# Patient Record
Sex: Male | Born: 1970 | Race: White | Hispanic: No | Marital: Married | Smoking: Never smoker
Health system: Southern US, Community
[De-identification: ages and names within clinical notes are randomized; demographics above are authoritative.]

## PROBLEM LIST (undated history)

## (undated) DIAGNOSIS — T8859XA Other complications of anesthesia, initial encounter: Secondary | ICD-10-CM

## (undated) DIAGNOSIS — E119 Type 2 diabetes mellitus without complications: Secondary | ICD-10-CM

## (undated) DIAGNOSIS — C801 Malignant (primary) neoplasm, unspecified: Secondary | ICD-10-CM

## (undated) DIAGNOSIS — F419 Anxiety disorder, unspecified: Secondary | ICD-10-CM

## (undated) DIAGNOSIS — I10 Essential (primary) hypertension: Secondary | ICD-10-CM

## (undated) DIAGNOSIS — Z9889 Other specified postprocedural states: Secondary | ICD-10-CM

## (undated) DIAGNOSIS — T4145XA Adverse effect of unspecified anesthetic, initial encounter: Secondary | ICD-10-CM

## (undated) DIAGNOSIS — R112 Nausea with vomiting, unspecified: Secondary | ICD-10-CM

## (undated) DIAGNOSIS — G4730 Sleep apnea, unspecified: Secondary | ICD-10-CM

## (undated) HISTORY — PX: APPENDECTOMY: SHX54

## (undated) HISTORY — PX: HIP ARTHROPLASTY: SHX981

## (undated) HISTORY — PX: HERNIA REPAIR: SHX51

## (undated) HISTORY — PX: CHOLECYSTECTOMY: SHX55

---
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
## 2012-01-12 DIAGNOSIS — C801 Malignant (primary) neoplasm, unspecified: Secondary | ICD-10-CM

## 2012-01-12 HISTORY — DX: Malignant (primary) neoplasm, unspecified: C80.1

## 2012-02-02 HISTORY — PX: VIDEO ASSISTED THORACOSCOPY (VATS)/ LOBECTOMY: SHX6169

## 2014-01-11 HISTORY — PX: OTHER SURGICAL HISTORY: SHX169

## 2016-06-22 ENCOUNTER — Other Ambulatory Visit: Payer: Self-pay | Admitting: Neurosurgery

## 2016-06-22 DIAGNOSIS — M5416 Radiculopathy, lumbar region: Secondary | ICD-10-CM

## 2016-06-28 ENCOUNTER — Ambulatory Visit
Admission: RE | Admit: 2016-06-28 | Discharge: 2016-06-28 | Disposition: A | Discharge status: Home / Self Care | Payer: BLUE CROSS/BLUE SHIELD | Source: Ambulatory Visit | Attending: Neurosurgery | Admitting: Neurosurgery

## 2016-06-28 DIAGNOSIS — M5416 Radiculopathy, lumbar region: Secondary | ICD-10-CM

## 2016-06-28 MED ORDER — GADOBENATE DIMEGLUMINE 529 MG/ML IV SOLN
20.0000 mL | Freq: Once | INTRAVENOUS | Status: AC | PRN
  Administered 2016-06-28: 20 mL via INTRAVENOUS

## 2016-08-02 ENCOUNTER — Ambulatory Visit: Payer: BLUE CROSS/BLUE SHIELD | Admitting: Sports Medicine

## 2016-08-03 ENCOUNTER — Ambulatory Visit (INDEPENDENT_AMBULATORY_CARE_PROVIDER_SITE_OTHER): Payer: BLUE CROSS/BLUE SHIELD | Admitting: Sports Medicine

## 2016-08-03 ENCOUNTER — Encounter: Payer: Self-pay | Admitting: Sports Medicine

## 2016-08-03 DIAGNOSIS — M7062 Trochanteric bursitis, left hip: Secondary | ICD-10-CM

## 2016-08-03 DIAGNOSIS — G5712 Meralgia paresthetica, left lower limb: Secondary | ICD-10-CM | POA: Insufficient documentation

## 2016-08-03 NOTE — Assessment & Plan Note (Signed)
History of hip labral tear, the arthrogram injection included some steroid has since relieved his groin pain, he now has pain on the lateral aspect of his hip with week hip abductors. This is consistent with trochanteric bursitis, injection today, formal physical therapy, turn to see me in one month.

## 2016-08-03 NOTE — Assessment & Plan Note (Signed)
Ultimately he does need to lose weight, and wear looser fitting clothing before we consider an injection.

## 2016-08-03 NOTE — Assessment & Plan Note (Signed)
Recommended that he follow-up with his PCP regarding weight loss medications, and possibly gastric sleeve.

## 2016-08-03 NOTE — Progress Notes (Signed)
Subjective:    I'm seeing this patient as a consultation for:  Dr. Margaretha Sheffield, Dr. Yvetta Coder, Dr. Jamesetta Orleans  CC: Left hip pain  HPI: This is a pleasant 46 year old male with a fairly complicated medical history, initially he had left-sided hip and back pain, he had an MRI and ultimately four epidurals with the pain management provider. Unfortunately he never had any relief, not even temporary after the injections.  Subsequently he was seen by Dr. Vertell Limber with neurosurgery, a lumbar spine MRI was done that was negative. Eventually he had an MR arthrogram of his left hip with steroid injection, the arthrogram did show some labral fraying, and steroid injection provided about 4 months of relief. Since then he's had pain that he localizes on the lateral hip, with occasional paresthesias into the lateral thigh. Pain at the lateral hip is severe, persistent and is the principal issue today.  Past medical history, Surgical history, Family history not pertinant except as noted below, Social history, Allergies, and medications have been entered into the medical record, reviewed, and no changes needed.   Review of Systems: No headache, visual changes, nausea, vomiting, diarrhea, constipation, dizziness, abdominal pain, skin rash, fevers, chills, night sweats, weight loss, swollen lymph nodes, body aches, joint swelling, muscle aches, chest pain, shortness of breath, mood changes, visual or auditory hallucinations.   Objective:   General: Well Developed, morbidly obese, and in no acute distress.  Neuro:  Extra-ocular muscles intact, able to move all 4 extremities, sensation grossly intact.  Deep tendon reflexes tested were normal. Psych: Alert and oriented, mood congruent with affect. ENT:  Ears and nose appear unremarkable.  Hearing grossly normal. Neck: Unremarkable overall appearance, trachea midline.  No visible thyroid enlargement. Eyes: Conjunctivae and lids appear unremarkable.  Pupils  equal and round. Skin: Warm and dry, no rashes noted.  Cardiovascular: Pulses palpable, no extremity edema. Left Hip: ROM IR: 60 Deg, ER: 60 Deg, Flexion: 120 Deg, Extension: 100 Deg, Abduction: 45 Deg, Adduction: 45 Deg Strength IR: 5/5, ER: 5/5, Flexion: 5/5, Extension: 5/5, Abduction: 5/5, Adduction: 5/5 Pelvic alignment unremarkable to inspection and palpation. Standing hip rotation and gait without trendelenburg / unsteadiness. Greater trochanter with severe tenderness to palpation. No tenderness over piriformis. No SI joint tenderness and normal minimal SI movement.  Procedure: Real-time Ultrasound Guided Injection of left entered bursa Device: GE Logiq E  Verbal informed consent obtained.  Time-out conducted.  Noted no overlying erythema, induration, or other signs of local infection.  Skin prepped in a sterile fashion.  Local anesthesia: Topical Ethyl chloride.  With sterile technique and under real time ultrasound guidance:  Using a 22-gauge spinal needle I placed medication both superficial to the hip abductor tendons, I then pierced the abductors, entered the bursa and injected the rest of medicine for a total of 1 mL kenalog 40, 2 mL bupivacaine, 2 mL lidocaine. Completed without difficulty  Pain immediately resolved suggesting accurate placement of the medication.  Advised to call if fevers/chills, erythema, induration, drainage, or persistent bleeding.  Images permanently stored and available for review in the ultrasound unit.  Impression: Technically successful ultrasound guided injection.  Impression and Recommendations:   This case required medical decision making of moderate complexity.  Meralgia paresthetica, left Ultimately he does need to lose weight, and wear looser fitting clothing before we consider an injection.  Morbid obesity ([place]) Recommended that he follow-up with his PCP regarding weight loss medications, and possibly gastric sleeve.  Trochanteric  bursitis, left hip  History of hip labral tear, the arthrogram injection included some steroid has since relieved his groin pain, he now has pain on the lateral aspect of his hip with week hip abductors. This is consistent with trochanteric bursitis, injection today, formal physical therapy, turn to see me in one month.

## 2016-08-31 ENCOUNTER — Ambulatory Visit: Payer: BLUE CROSS/BLUE SHIELD | Admitting: Sports Medicine

## 2018-07-01 IMAGING — MR MR LUMBAR SPINE WO/W CM
4 of 7 series · 28 of 48 positions shown · IV contrast (multihance)
Comparison: Outside lumbar spine MRI 01/08/2016

CLINICAL DATA: Lumbar radiculopathy. Low back pain. Left hip and
left foot numbness. History of lung cancer.

Creatinine was obtained on site at [HOSPITAL] at [HOSPITAL].
Results: Creatinine 0.8 mg/dL.
EXAM:
MRI LUMBAR SPINE WITHOUT AND WITH CONTRAST
TECHNIQUE: Multiplanar and multiecho pulse sequences of the lumbar spine were
obtained without and with intravenous contrast.
CONTRAST:  20mL MULTIHANCE GADOBENATE DIMEGLUMINE 529 MG/ML IV SOLN

[Series 4: T1 · sagittal · 4.0mm · 0.59mm/px · 4 of 13 slices shown (1 of 2)]
[im 1/13]
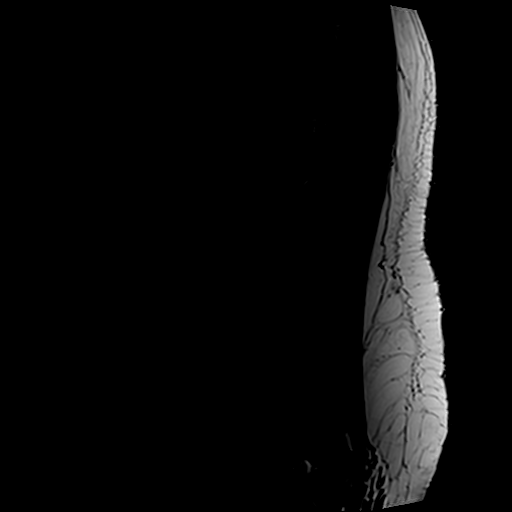
[im 5/13]
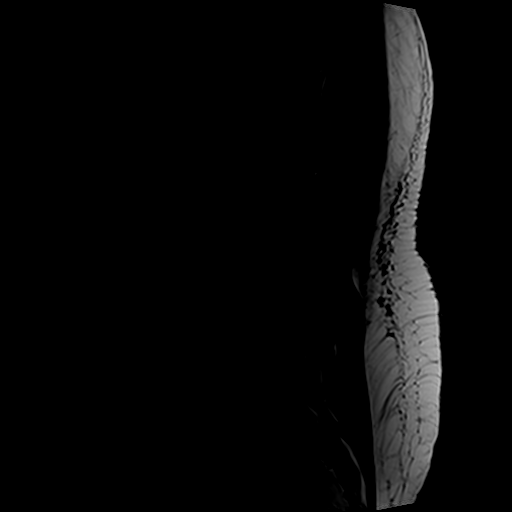
[im 9/13]
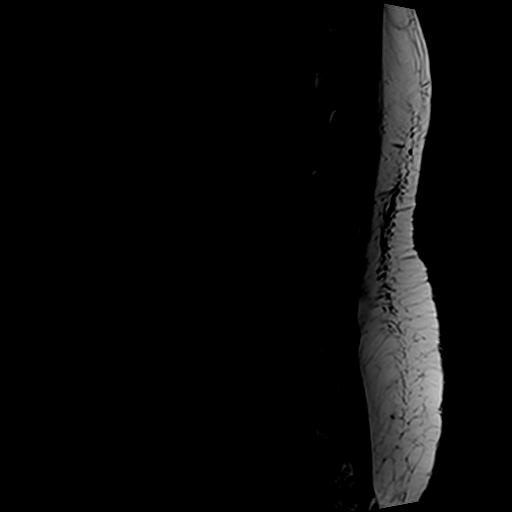
[im 13/13]
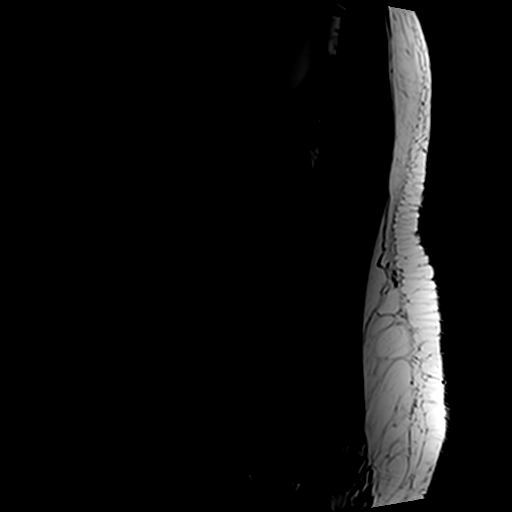

[Series 5: T2 post-contrast · sagittal · 4.0mm · 0.55mm/px · 4 of 13 slices shown]
[im 1/13]
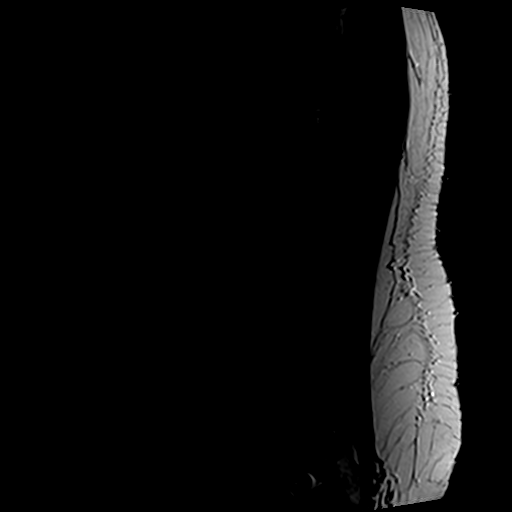
[im 5/13]
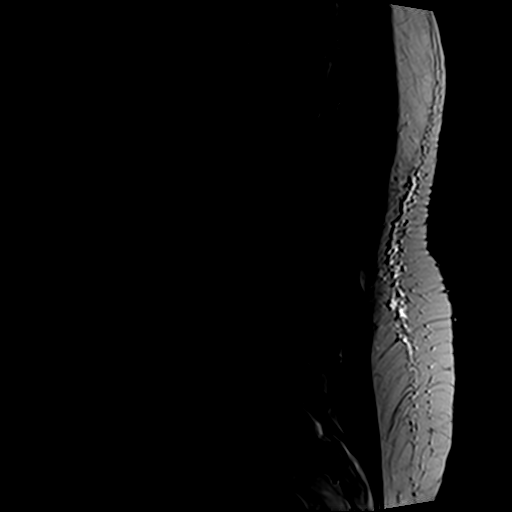
[im 9/13]
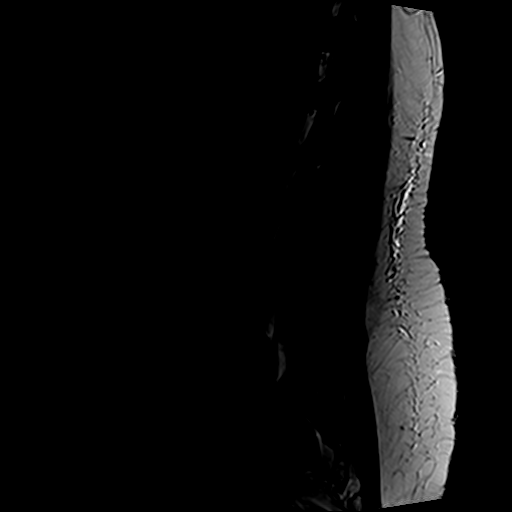
[im 13/13]
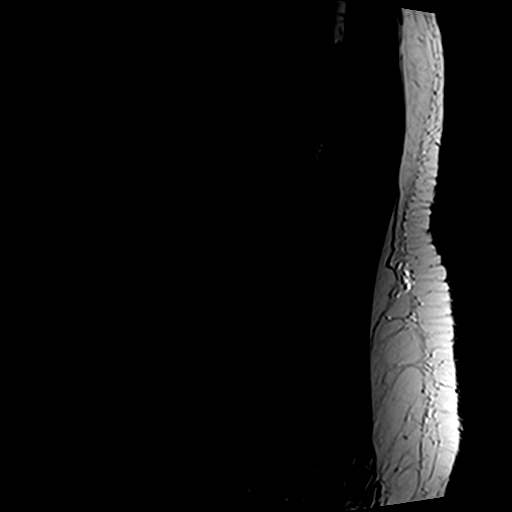

[Series 6: T2 · axial · 4.0mm · 0.70mm/px · z∈[-52,+154]mm · 11 of 38 slices shown]
[im 1/38]
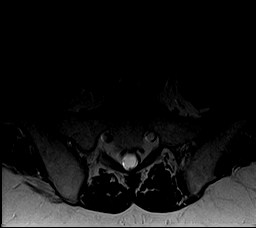
[im 4/38]
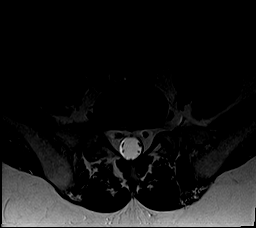
[im 8/38]
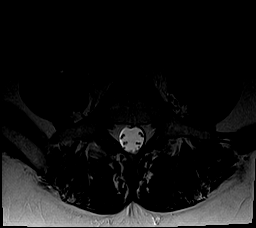
[im 12/38]
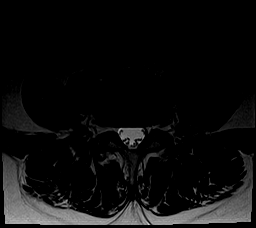
[im 15/38]
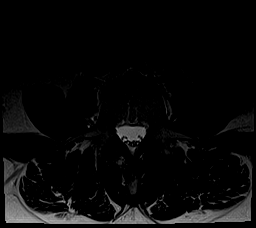
[im 19/38]
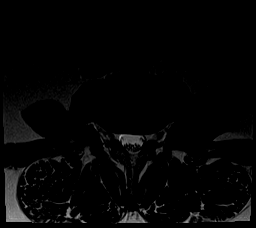
[im 23/38]
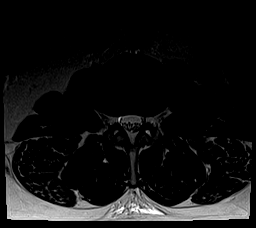
[im 26/38]
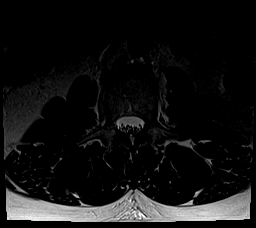
[im 30/38]
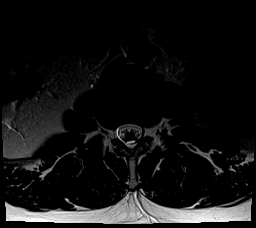
[im 34/38]
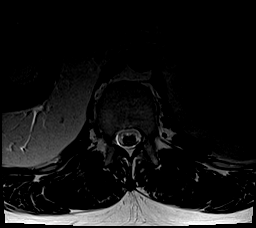
[im 38/38]
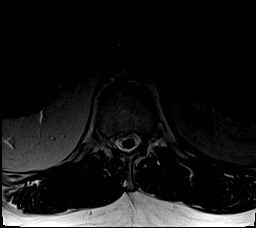

[Series 7: T1 · axial · 4.0mm · 0.35mm/px · z∈[-52,+134]mm · 9 of 38 slices shown (2 of 2)]
[im 1/38]
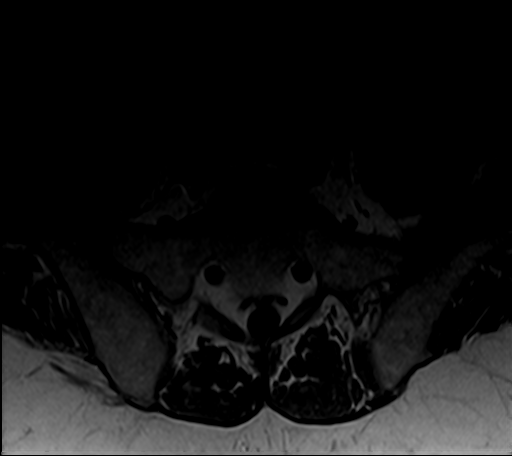
[im 4/38]
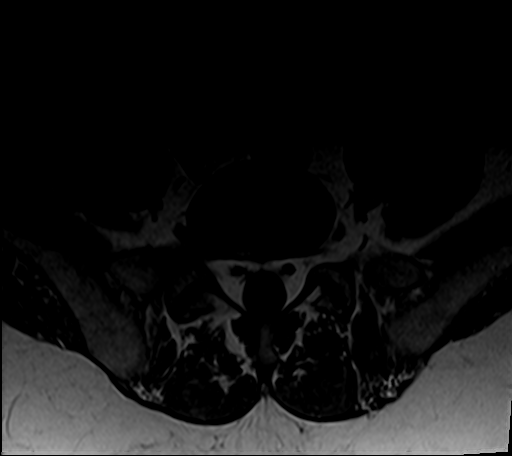
[im 8/38]
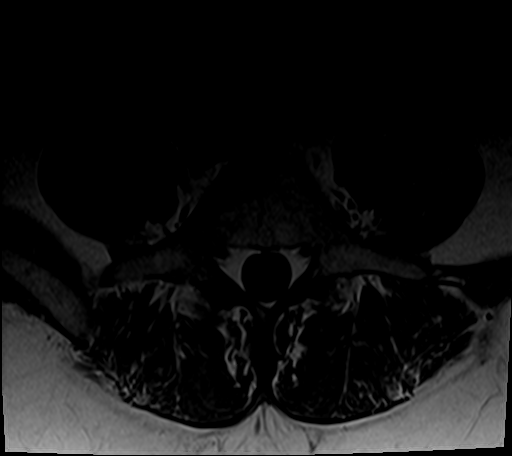
[im 12/38]
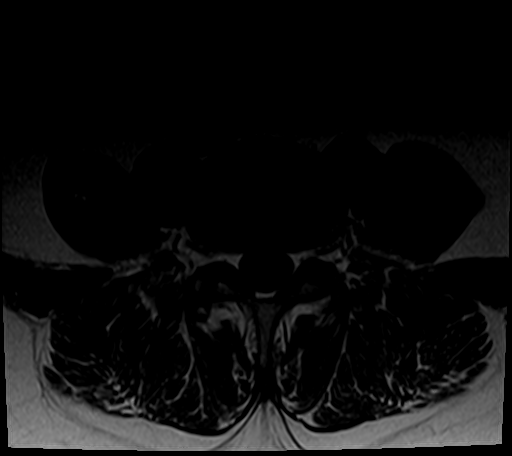
[im 15/38]
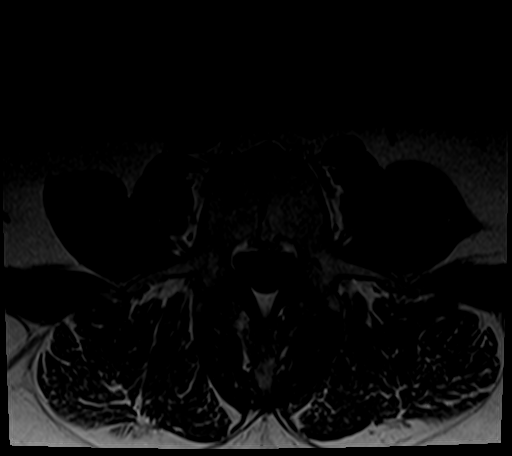
[im 19/38]
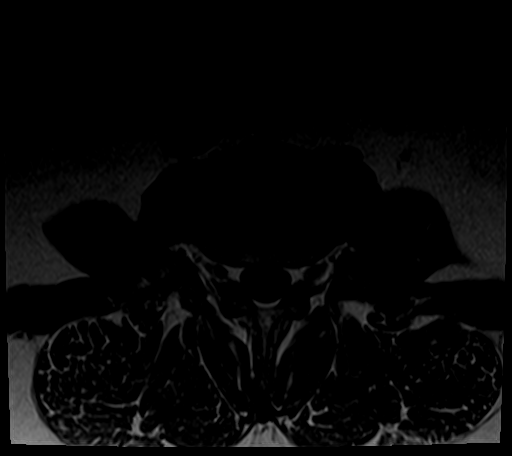
[im 23/38]
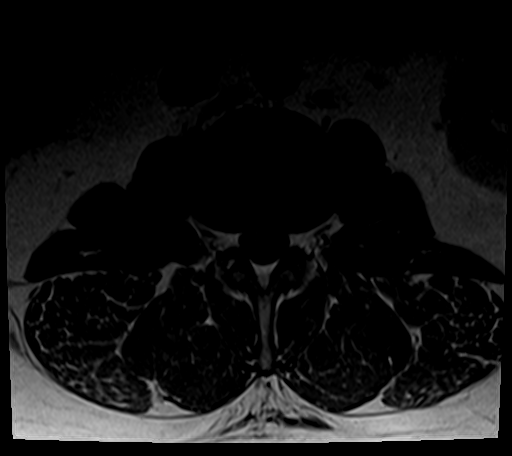
[im 26/38]
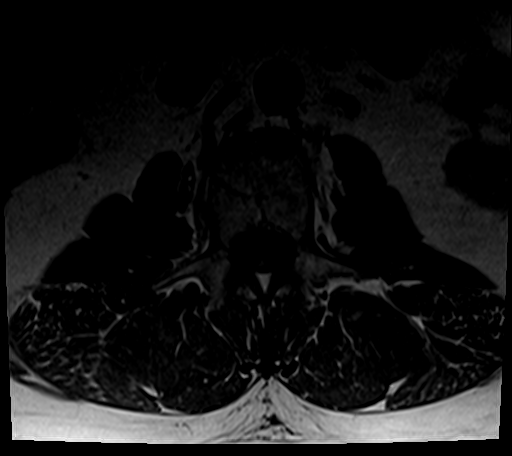
[im 34/38]
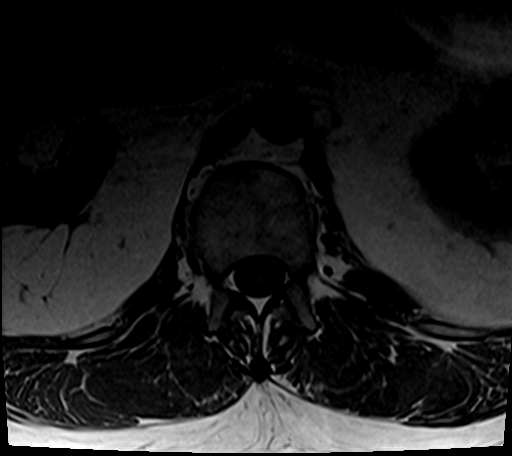

[28 of 48 positions shown; findings below may reference images not displayed]

FINDINGS: Segmentation:  Standard.

Alignment: Trace retrolisthesis of L2 on L3, L3 on L4, and L5 on S1,
unchanged.

Vertebrae: No fracture, osseous lesion, or significant marrow edema.
No abnormal enhancement. Small lower thoracic Schmorl's nodes,
unchanged.

Conus medullaris: Extends to the upper L2 level and appears normal.

Paraspinal and other soft tissues: Unremarkable.

Disc levels:

Disc desiccation from L2-3 to L4-5 without significant disc space
height loss.

T12-L1 and L1-2:  Negative.

L2-3:  Trace disc bulging without stenosis.

L3-4: Mild disc bulging without stenosis, unchanged. A tiny central
annular fissure is more conspicuous on the current study.

L4-5: Minimal disc bulging and mild facet arthrosis without
stenosis, unchanged.

L5-S1:  Negative.
IMPRESSION: Mild lumbar disc and facet degeneration without significant interval
change. No evidence of neural impingement.

## 2018-09-28 ENCOUNTER — Encounter (HOSPITAL_COMMUNITY): Payer: Self-pay

## 2018-09-28 NOTE — Patient Instructions (Signed)
Your procedure is scheduled on: 10/04/2018  Report to [facility] at 7:00    AM.  Call this number if you have problems the morning of surgery: [phone]   Remember:   Do not Eat or Drink after midnight   :  Take these medicines the morning of surgery with A SIP OF WATER: Benicar, Carvedilol, lexapro, omeprazole, zertec, and      Gabapetin if needed   Do not wear jewelry, make-up or nail polish.  Do not wear lotions, powders, or perfumes. You may wear deodorant.  Do not shave 48 hours prior to surgery. Men may shave face and neck.  Do not bring valuables to the hospital.  Contacts, dentures or bridgework may not be worn into surgery.  Leave suitcase in the car. After surgery it may be brought to your room.  For patients admitted to the hospital, checkout time is 11:00 AM the day of discharge.   Patients discharged the day of surgery will not be allowed to drive home.    Special Instructions: Shower using CHG night before surgery and shower the day of surgery use CHG.  Use special wash - you have one bottle of CHG for all showers.  You should use approximately 1/2 of the bottle for each shower.  Wound Care, Adult Taking care of your wound properly can help to prevent pain, infection, and scarring. It can also help your wound to heal more quickly. How to care for your wound Wound care      Follow instructions from your health care provider about how to take care of your wound. Make sure you: ? Wash your hands with soap and water before you change the bandage (dressing). If soap and water are not available, use hand sanitizer. ? Change your dressing as told by your health care provider. ? Leave stitches (sutures), skin glue, or adhesive strips in place. These skin closures may need to stay in place for 2 weeks or longer. If adhesive strip edges start to loosen and curl up, you may trim the loose edges. Do not remove adhesive strips completely unless your health care provider tells  you to do that.  Check your wound area every day for signs of infection. Check for: ? Redness, swelling, or pain. ? Fluid or blood. ? Warmth. ? Pus or a bad smell.  Ask your health care provider if you should clean the wound with mild soap and water. Doing this may include: ? Using a clean towel to pat the wound dry after cleaning it. Do not rub or scrub the wound. ? Applying a cream or ointment. Do this only as told by your health care provider. ? Covering the incision with a clean dressing.  Ask your health care provider when you can leave the wound uncovered.  Keep the dressing dry until your health care provider says it can be removed. Do not take baths, swim, use a hot tub, or do anything that would put the wound underwater until your health care provider approves. Ask your health care provider if you can take showers. You may only be allowed to take sponge baths. Medicines     Take over-the-counter and prescription medicines only as told by your health care provider. If you were prescribed pain medicine, take it 30 or more minutes before you do any wound care or as told by your health care provider. General instructions  Return to your normal activities as told by your health care provider. Ask your health  care provider what activities are safe.  Do not scratch or pick at the wound.  Do not use any products that contain nicotine or tobacco, such as cigarettes and e-cigarettes. These may delay wound healing. If you need help quitting, ask your health care provider.  Keep all follow-up visits as told by your health care provider. This is important.  Eat a diet that includes protein, vitamin A, vitamin C, and other nutrient-rich foods to help the wound heal. ? Foods rich in protein include meat, dairy, beans, nuts, and other sources. ? Foods rich in vitamin A include carrots and dark green, leafy vegetables. ? Foods rich in vitamin C include citrus, tomatoes, and other fruits  and vegetables. ? Nutrient-rich foods have protein, carbohydrates, fat, vitamins, or minerals. Eat a variety of healthy foods including vegetables, fruits, and whole grains. Contact a health care provider if:  You received a tetanus shot and you have swelling, severe pain, redness, or bleeding at the injection site.  Your pain is not controlled with medicine.  You have redness, swelling, or pain around the wound.  You have fluid or blood coming from the wound.  Your wound feels warm to the touch.  You have pus or a bad smell coming from the wound.  You have a fever or chills.  You are nauseous or you vomit.  You are dizzy. Get help right away if:  You have a red streak going away from your wound.  The edges of the wound open up and separate.  Your wound is bleeding, and the bleeding does not stop with gentle pressure.  You have a rash.  You faint.  You have trouble breathing. Summary  Always wash your hands with soap and water before changing your bandage (dressing).  To help with healing, eat foods that are rich in protein, vitamin A, vitamin C, and other nutrients.  Check your wound every day for signs of infection. Contact your health care provider if you suspect that your wound is infected. This information is not intended to replace advice given to you by your health care provider. Make sure you discuss any questions you have with your health care provider. Document Released: 10/07/2007 Document Revised: 04/17/2018 Document Reviewed: 07/15/2015 Elsevier Patient Education  [place] These instructions provide you with information about caring for yourself after your procedure. Your health care provider may also give you more specific instructions. Your treatment has been planned according to current medical practices, but problems sometimes occur. Call your health care provider if you have any problems or questions  after your procedure. What can I expect after the procedure? After your procedure, you may:  Feel sleepy for several hours.  Feel clumsy and have poor balance for several hours.  Feel forgetful about what happened after the procedure.  Have poor judgment for several hours.  Feel nauseous or vomit.  Have a sore throat if you had a breathing tube during the procedure. Follow these instructions at home: For at least 24 hours after the procedure:      Have a responsible adult stay with you. It is important to have someone help care for you until you are awake and alert.  Rest as needed.  Do not: ? Participate in activities in which you could fall or become injured. ? Drive. ? Use heavy machinery. ? Drink alcohol. ? Take sleeping pills or medicines that cause drowsiness. ? Make important decisions or sign legal documents. ?  Take care of children on your own. Eating and drinking  Follow the diet that is recommended by your health care provider.  If you vomit, drink water, juice, or soup when you can drink without vomiting.  Make sure you have little or no nausea before eating solid foods. General instructions  Take over-the-counter and prescription medicines only as told by your health care provider.  If you have sleep apnea, surgery and certain medicines can increase your risk for breathing problems. Follow instructions from your health care provider about wearing your sleep device: ? Anytime you are sleeping, including during daytime naps. ? While taking prescription pain medicines, sleeping medicines, or medicines that make you drowsy.  If you smoke, do not smoke without supervision.  Keep all follow-up visits as told by your health care provider. This is important. Contact a health care provider if:  You keep feeling nauseous or you keep vomiting.  You feel light-headed.  You develop a rash.  You have a fever. Get help right away if:  You have trouble  breathing. Summary  For several hours after your procedure, you may feel sleepy and have poor judgment.  Have a responsible adult stay with you for at least 24 hours or until you are awake and alert. This information is not intended to replace advice given to you by your health care provider. Make sure you discuss any questions you have with your health care provider. Document Released: 04/20/2015 Document Revised: 03/28/2017 Document Reviewed: 04/20/2015 Elsevier Patient Education  2020 [employer].

## 2018-10-02 ENCOUNTER — Other Ambulatory Visit (HOSPITAL_COMMUNITY)
Admission: RE | Admit: 2018-10-02 | Discharge: 2018-10-02 | Disposition: A | Discharge status: Home / Self Care | Payer: BC Managed Care – PPO | Source: Ambulatory Visit | Attending: Podiatry | Admitting: Podiatry

## 2018-10-02 ENCOUNTER — Encounter (HOSPITAL_COMMUNITY)
Admission: RE | Admit: 2018-10-02 | Discharge: 2018-10-02 | Disposition: A | Discharge status: Home / Self Care | Payer: BC Managed Care – PPO | Source: Ambulatory Visit | Attending: Podiatry | Admitting: Podiatry

## 2018-10-02 ENCOUNTER — Encounter (HOSPITAL_COMMUNITY): Payer: Self-pay

## 2018-10-02 ENCOUNTER — Other Ambulatory Visit: Payer: Self-pay

## 2018-10-02 ENCOUNTER — Ambulatory Visit (HOSPITAL_COMMUNITY)
Admission: RE | Admit: 2018-10-02 | Discharge: 2018-10-02 | Disposition: A | Discharge status: Home / Self Care | Payer: BC Managed Care – PPO | Source: Ambulatory Visit | Attending: Podiatry | Admitting: Podiatry

## 2018-10-02 DIAGNOSIS — M19071 Primary osteoarthritis, right ankle and foot: Secondary | ICD-10-CM | POA: Diagnosis not present

## 2018-10-02 DIAGNOSIS — Z20828 Contact with and (suspected) exposure to other viral communicable diseases: Secondary | ICD-10-CM | POA: Diagnosis not present

## 2018-10-02 DIAGNOSIS — M79671 Pain in right foot: Secondary | ICD-10-CM | POA: Insufficient documentation

## 2018-10-02 DIAGNOSIS — R52 Pain, unspecified: Secondary | ICD-10-CM | POA: Diagnosis present

## 2018-10-02 DIAGNOSIS — Z01818 Encounter for other preprocedural examination: Secondary | ICD-10-CM | POA: Insufficient documentation

## 2018-10-02 HISTORY — DX: Anxiety disorder, unspecified: F41.9

## 2018-10-02 HISTORY — DX: Sleep apnea, unspecified: G47.30

## 2018-10-02 HISTORY — DX: Other specified postprocedural states: Z98.890

## 2018-10-02 HISTORY — DX: Other specified postprocedural states: R11.2

## 2018-10-02 HISTORY — DX: Essential (primary) hypertension: I10

## 2018-10-02 HISTORY — DX: Other complications of anesthesia, initial encounter: T88.59XA

## 2018-10-02 HISTORY — DX: Type 2 diabetes mellitus without complications: E11.9

## 2018-10-02 LAB — BASIC METABOLIC PANEL
Anion gap: 10 (ref 5–15)
BUN: 11 mg/dL (ref 6–20)
CO2: 24 mmol/L (ref 22–32)
Calcium: 9.1 mg/dL (ref 8.9–10.3)
Chloride: 103 mmol/L (ref 98–111)
Creatinine, Ser: 0.74 mg/dL (ref 0.61–1.24)
GFR calc Af Amer: >60 mL/min (ref >60)
GFR calc non Af Amer: >60 mL/min (ref >60)
Glucose, Bld: 145 mg/dL — ABNORMAL HIGH (ref 70–99)
Potassium: 4 mmol/L (ref 3.5–5.1)
Sodium: 137 mmol/L (ref 135–145)

## 2018-10-02 LAB — SARS CORONAVIRUS 2 (TAT 6-24 HRS): SARS Coronavirus 2: NEGATIVE

## 2018-10-02 LAB — HEMOGLOBIN A1C
Hgb A1c MFr Bld: 6.7 % — ABNORMAL HIGH (ref 4.8–5.6)
Mean Plasma Glucose: 145.59 mg/dL

## 2018-10-02 LAB — GLUCOSE, CAPILLARY: Glucose-Capillary: 152 mg/dL — ABNORMAL HIGH (ref 70–99)

## 2018-10-04 ENCOUNTER — Ambulatory Visit (HOSPITAL_COMMUNITY): Payer: BC Managed Care – PPO

## 2018-10-04 ENCOUNTER — Ambulatory Visit (HOSPITAL_COMMUNITY): Payer: BC Managed Care – PPO | Admitting: Anesthesiology

## 2018-10-04 ENCOUNTER — Ambulatory Visit (HOSPITAL_COMMUNITY)
Admission: RE | Admit: 2018-10-04 | Discharge: 2018-10-04 | Disposition: A | Discharge status: Home / Self Care | Payer: BC Managed Care – PPO | Attending: Podiatry | Admitting: Podiatry

## 2018-10-04 ENCOUNTER — Encounter (HOSPITAL_COMMUNITY): Payer: Self-pay | Admitting: *Deleted

## 2018-10-04 ENCOUNTER — Encounter (HOSPITAL_COMMUNITY)
Admission: RE | Disposition: A | Discharge status: Home / Self Care | Payer: Self-pay | Source: Home / Self Care | Attending: Podiatry

## 2018-10-04 DIAGNOSIS — M205X1 Other deformities of toe(s) (acquired), right foot: Secondary | ICD-10-CM | POA: Insufficient documentation

## 2018-10-04 DIAGNOSIS — F419 Anxiety disorder, unspecified: Secondary | ICD-10-CM | POA: Insufficient documentation

## 2018-10-04 DIAGNOSIS — J45909 Unspecified asthma, uncomplicated: Secondary | ICD-10-CM | POA: Diagnosis not present

## 2018-10-04 DIAGNOSIS — I10 Essential (primary) hypertension: Secondary | ICD-10-CM | POA: Insufficient documentation

## 2018-10-04 DIAGNOSIS — Z9889 Other specified postprocedural states: Secondary | ICD-10-CM

## 2018-10-04 HISTORY — PX: ARTHRODESIS METATARSALPHALANGEAL JOINT (MTPJ): SHX6566

## 2018-10-04 LAB — GLUCOSE, CAPILLARY
Glucose-Capillary: 153 mg/dL — ABNORMAL HIGH (ref 70–99)
Glucose-Capillary: 148 mg/dL — ABNORMAL HIGH (ref 70–99)

## 2018-10-04 SURGERY — FUSION, JOINT, GREAT TOE
Anesthesia: Monitor Anesthesia Care | Site: First Toe | Laterality: Right

## 2018-10-04 MED ORDER — PROMETHAZINE HCL 25 MG/ML IJ SOLN: 6.2500 mg | INTRAMUSCULAR | Status: DC | PRN

## 2018-10-04 MED ORDER — PROPOFOL 10 MG/ML IV BOLUS
INTRAVENOUS | Status: AC
  Filled 2018-10-04: qty 40

## 2018-10-04 MED ORDER — MIDAZOLAM HCL 5 MG/5ML IJ SOLN
INTRAMUSCULAR | Status: DC | PRN
  Administered 2018-10-04: 2 mg via INTRAVENOUS

## 2018-10-04 MED ORDER — PROPOFOL 10 MG/ML IV BOLUS
INTRAVENOUS | Status: DC | PRN
  Administered 2018-10-04: 20 mg via INTRAVENOUS

## 2018-10-04 MED ORDER — MIDAZOLAM HCL 2 MG/2ML IJ SOLN
INTRAMUSCULAR | Status: AC
  Filled 2018-10-04: qty 2

## 2018-10-04 MED ORDER — HYDROMORPHONE HCL 1 MG/ML IJ SOLN: 0.2500 mg | INTRAMUSCULAR | Status: DC | PRN

## 2018-10-04 MED ORDER — LIDOCAINE HCL (PF) 1 % IJ SOLN: INTRAMUSCULAR | Status: DC | PRN

## 2018-10-04 MED ORDER — LACTATED RINGERS IV SOLN
INTRAVENOUS | Status: DC
  Administered 2018-10-04: 11:00:00 via INTRAVENOUS
  Administered 2018-10-04: 1000 mL via INTRAVENOUS

## 2018-10-04 MED ORDER — FENTANYL CITRATE (PF) 100 MCG/2ML IJ SOLN
INTRAMUSCULAR | Status: DC | PRN
  Administered 2018-10-04 (×4): 25 ug via INTRAVENOUS

## 2018-10-04 MED ORDER — KETAMINE HCL 10 MG/ML IJ SOLN
INTRAMUSCULAR | Status: DC | PRN
  Administered 2018-10-04: 20 mg via INTRAVENOUS
  Administered 2018-10-04: 10 mg via INTRAVENOUS

## 2018-10-04 MED ORDER — ONDANSETRON HCL 4 MG/2ML IJ SOLN
INTRAMUSCULAR | Status: AC
  Filled 2018-10-04: qty 2

## 2018-10-04 MED ORDER — ONDANSETRON HCL 4 MG/2ML IJ SOLN
INTRAMUSCULAR | Status: DC | PRN
  Administered 2018-10-04: 4 mg via INTRAVENOUS

## 2018-10-04 MED ORDER — PROPOFOL 500 MG/50ML IV EMUL
INTRAVENOUS | Status: DC | PRN
  Administered 2018-10-04 (×4): via INTRAVENOUS
  Administered 2018-10-04: 50 ug/kg/min via INTRAVENOUS
  Administered 2018-10-04: 150 ug/kg/min via INTRAVENOUS

## 2018-10-04 MED ORDER — HYDROCODONE-ACETAMINOPHEN 7.5-325 MG PO TABS: 1.0000 | ORAL_TABLET | Freq: Once | ORAL | Status: DC | PRN

## 2018-10-04 MED ORDER — FENTANYL CITRATE (PF) 100 MCG/2ML IJ SOLN
INTRAMUSCULAR | Status: AC
  Filled 2018-10-04: qty 2

## 2018-10-04 MED ORDER — MIDAZOLAM HCL 2 MG/2ML IJ SOLN: 0.5000 mg | Freq: Once | INTRAMUSCULAR | Status: DC | PRN

## 2018-10-04 MED ORDER — BUPIVACAINE HCL (PF) 0.5 % IJ SOLN
INTRAMUSCULAR | Status: AC
  Filled 2018-10-04: qty 30

## 2018-10-04 MED ORDER — PROPOFOL 10 MG/ML IV BOLUS
INTRAVENOUS | Status: AC
  Filled 2018-10-04: qty 20

## 2018-10-04 MED ORDER — SUCCINYLCHOLINE CHLORIDE 200 MG/10ML IV SOSY
PREFILLED_SYRINGE | INTRAVENOUS | Status: AC
  Filled 2018-10-04: qty 20

## 2018-10-04 MED ORDER — CLINDAMYCIN PHOSPHATE 600 MG/50ML IV SOLN
600.0000 mg | Freq: Once | INTRAVENOUS | Status: AC
  Administered 2018-10-04: 600 mg via INTRAVENOUS
  Filled 2018-10-04: qty 50

## 2018-10-04 MED ORDER — PHENYLEPHRINE HCL (PRESSORS) 10 MG/ML IV SOLN
INTRAVENOUS | Status: DC | PRN
  Administered 2018-10-04: 80 ug via INTRAVENOUS

## 2018-10-04 MED ORDER — 0.9 % SODIUM CHLORIDE (POUR BTL) OPTIME
TOPICAL | Status: DC | PRN
  Administered 2018-10-04: 1000 mL

## 2018-10-04 MED ORDER — LIDOCAINE HCL 1 % IJ SOLN
INTRAMUSCULAR | Status: DC | PRN
  Administered 2018-10-04: 20 mL

## 2018-10-04 MED ORDER — LIDOCAINE HCL (CARDIAC) PF 100 MG/5ML IV SOSY
PREFILLED_SYRINGE | INTRAVENOUS | Status: DC | PRN
  Administered 2018-10-04: 40 mg via INTRAVENOUS

## 2018-10-04 MED ORDER — KETAMINE HCL 50 MG/5ML IJ SOSY
PREFILLED_SYRINGE | INTRAMUSCULAR | Status: AC
  Filled 2018-10-04: qty 5

## 2018-10-04 SURGICAL SUPPLY — 68 items
BANDAGE ESMARK 4X12 BL STRL LF (DISPOSABLE) ×1 IMPLANT
BENZOIN TINCTURE PRP APPL 2/3 (GAUZE/BANDAGES/DRESSINGS) ×3 IMPLANT
BIT DRILL 2.35X50 (BIT) ×3 IMPLANT
BIT DRILL CANN 3.0 (BIT) ×3 IMPLANT
BLADE AVERAGE 25MMX9MM (BLADE) ×1
BLADE AVERAGE 25X9 (BLADE) ×2 IMPLANT
BLADE SURG 15 STRL LF DISP TIS (BLADE) ×1 IMPLANT
BLADE SURG 15 STRL SS (BLADE) ×2
BNDG CONFORM 2 STRL LF (GAUZE/BANDAGES/DRESSINGS) ×3 IMPLANT
BNDG ELASTIC 4X5.8 VLCR NS LF (GAUZE/BANDAGES/DRESSINGS) ×9 IMPLANT
BNDG ELASTIC 6X5.8 VLCR NS LF (GAUZE/BANDAGES/DRESSINGS) ×3 IMPLANT
BNDG ESMARK 4X12 BLUE STRL LF (DISPOSABLE) ×3
BNDG GAUZE ELAST 4 BULKY (GAUZE/BANDAGES/DRESSINGS) ×3 IMPLANT
BUR FAST CUTTING (BURR) ×2
BUR SRGRND 54.5X3.2X8 (BURR) ×1 IMPLANT
BURR SRGRND 54.5X3.2X8 (BURR) ×1
CHLORAPREP W/TINT 26 (MISCELLANEOUS) ×3 IMPLANT
CLOSURE WOUND 1/2 X4 (GAUZE/BANDAGES/DRESSINGS) ×1
CLOTH BEACON ORANGE TIMEOUT ST (SAFETY) ×3 IMPLANT
COVER LIGHT HANDLE STERIS (MISCELLANEOUS) ×6 IMPLANT
COVER WAND RF STERILE (DRAPES) ×3 IMPLANT
CUFF TOURN SGL QUICK 18X4 (TOURNIQUET CUFF) ×3 IMPLANT
DECANTER SPIKE VIAL GLASS SM (MISCELLANEOUS) ×6 IMPLANT
DRAPE OEC MINIVIEW 54X84 (DRAPES) ×3 IMPLANT
DRSG ADAPTIC 3X8 NADH LF (GAUZE/BANDAGES/DRESSINGS) ×3 IMPLANT
ELECT REM PT RETURN 9FT ADLT (ELECTROSURGICAL) ×3
ELECTRODE REM PT RTRN 9FT ADLT (ELECTROSURGICAL) ×1 IMPLANT
GAUZE SPONGE 4X4 12PLY STRL (GAUZE/BANDAGES/DRESSINGS) ×3 IMPLANT
GLOVE BIO SURGEON STRL SZ7 (GLOVE) ×6 IMPLANT
GLOVE BIO SURGEON STRL SZ7.5 (GLOVE) ×3 IMPLANT
GLOVE BIOGEL PI IND STRL 7.0 (GLOVE) ×3 IMPLANT
GLOVE BIOGEL PI IND STRL 7.5 (GLOVE) ×1 IMPLANT
GLOVE BIOGEL PI INDICATOR 7.0 (GLOVE) ×6
GLOVE BIOGEL PI INDICATOR 7.5 (GLOVE) ×2
GLOVE ECLIPSE 7.0 STRL STRAW (GLOVE) ×6 IMPLANT
GOWN STRL REUS W/ TWL LRG LVL3 (GOWN DISPOSABLE) ×1 IMPLANT
GOWN STRL REUS W/TWL LRG LVL3 (GOWN DISPOSABLE) ×11 IMPLANT
K wire dual ×3 IMPLANT
K-WIRE 1.1 (WIRE) ×6
K-WIRE 1.6 65 OLIVE 1.5 (WIRE) ×6
K-WIRE 1.6X150 (WIRE) ×6
K-WIRE FX100X1.1XTROC TIP (WIRE) ×3
K-WIRE FX150X1.6XTROC TIP (WIRE) ×3
KIT TURNOVER CYSTO (KITS) ×3 IMPLANT
KWIRE 1.6 65 OLIVE 1.5 (WIRE) ×2 IMPLANT
KWIRE FX 150X1.6 TROC TP APTUS (WIRE) ×3 IMPLANT
KWIRE FX100X1.1XTROC TIP (WIRE) ×3 IMPLANT
MANIFOLD NEPTUNE II (INSTRUMENTS) ×3 IMPLANT
NEEDLE HYPO 25X1 1.5 SAFETY (NEEDLE) ×6 IMPLANT
NS IRRIG 1000ML POUR BTL (IV SOLUTION) ×3 IMPLANT
PACK BASIC LIMB (CUSTOM PROCEDURE TRAY) ×3 IMPLANT
PAD ARMBOARD 7.5X6 YLW CONV (MISCELLANEOUS) ×3 IMPLANT
PLATE TRILOCK 2.8 MTP FSN 5D R (Plate) ×1 IMPLANT
RASP SM TEAR CROSS CUT (RASP) ×3 IMPLANT
REAMER CANNULATED CONE 18 (ORTHOPEDIC DISPOSABLE SUPPLIES) ×3 IMPLANT
SCREW 2.8 TRILOCK 16 (Screw) ×3 IMPLANT
SCREW COMPRSCREW CANN 3.0 34MM (Screw) ×3 IMPLANT
SCREW CORT HD7 2.8X18 (Screw) ×3 IMPLANT
SCREW TRILOCK 2.8X18 (Screw) ×15 IMPLANT
SET BASIN LINEN APH (SET/KITS/TRAYS/PACK) ×3 IMPLANT
SPONGE LAP 18X18 RF (DISPOSABLE) ×3 IMPLANT
STRIP CLOSURE SKIN 1/2X4 (GAUZE/BANDAGES/DRESSINGS) ×2 IMPLANT
SUT ETHILON 3 0 FSL (SUTURE) ×3 IMPLANT
SUT VIC AB 2-0 CT2 27 (SUTURE) ×3 IMPLANT
SUT VIC AB 4-0 PS2 27 (SUTURE) ×3 IMPLANT
SUT VICRYL AB 3-0 FS1 BRD 27IN (SUTURE) ×3 IMPLANT
SYR CONTROL 10ML LL (SYRINGE) ×6 IMPLANT
TRILOCK 2.8 MTP FUSION PL 5D R (Plate) ×3 IMPLANT

## 2018-10-04 NOTE — Op Note (Signed)
10/04/2018  10:52 AM  PATIENT:  Mark Wiggins  48 y.o. male  PRE-OPERATIVE DIAGNOSIS:  hallux limitus right foot, osteoarthritis of right great toe  POST-OPERATIVE DIAGNOSIS:  hallux limitus right foot, osteoarthritis of right great toe  PROCEDURE:  Procedure(s): FUSION OF RIGHT GREAT TOE (Right)  SURGEON:  Surgeon(s) and Role:    Tyson Babinski, DPM - Primary   PHYSICIAN ASSISTANT:    Caprice Beaver, DPM - Assisting  ANESTHESIA:   local and MAC  EBL:  5 mL   Materials used. [employer] 1st MPJ fusion plate and screws x 6    LOCAL MEDICATIONS USED:  MARCAINE   , LIDOCAINE  and Amount: 20 ml  SPECIMEN:  No Specimen  DISPOSITION OF SPECIMEN:  N/A  COUNTS:  YES  TOURNIQUET:   Total Tourniquet Time Documented: Calf (Right) - 131 minutes Total: Calf (Right) - 131 minutes  Patient was brought into the operating room laid supine on the operating table. Ankle tourniquet was applied to the surgical extremity. Following IV sedation, a local block was achieved using 20 cc of mixture of 1% plain lidocaine with 0.5% marcaine. The foot was the prepped, scrubbed and draped in aseptic manner. Using an esmarch band the tourniquet on the surgical site was inflatted at 278mHG.   Attention was directed to the dorsal medial aspect of therightfoot. A 7cm dorsal linear longitudinal incision was made medial and parallel to the extensor hallucis longus tendon. Dissection was continued deep down to the level of the first metatarsophalangeal joint.A linear longitudinal incision was made along the dorsal aspect of the first metatarsophalangeal joint capsule. The periosteal capsular structures were reflected from the head of the first metatarsal and base of the proximal phalanx. The articular surface of the first metatarsal head was evaluated. Approximately 90% of the articular surface was yellow and eroded completely. There was also loose bone at the base of the proximal phalanx  and the head of the first metatarsal. The bone was removed using bone rongeur.  Using a cone and cup reamer, the remaining articular surface was resected from the head of the first metatarsal and base of the proximal phalanx.The wound was flushed at this time. Any remaining dorsal and medial exostosis was removed using bone rongeur. At this time the subchondral drilling was performed with K wire at the base of the proximal phalanx and the head of the 1st metatarsal. A temporary K wire was inserted from distal medial to proximal lateral fashion through the joint. The position of the toe and the K wire was checked through fluoroscopy. A compression screw of 3.0 393mcannulated was inserted through the K wire. While the screw was inserted the dorsal cortex cracked on proximal phalanx.. The decision was made to remove the screw. A 4.5' inch KwHerma Ardas inserted as temporary fixation for compression of the joint.  A 5 degree dorsiflexion plate from [employer] was applied at the dorsal aspect of the 1st MPJand fixated with locking screws with good compression. The temporary K wire was removed. Position of the plate and screws with were confirmed with fluoroscopy. The surgical field was irrigated with copious amounts of sterile irrigant. The periosteal andcapsular structures were reapproximated using Vicryl. The subcutaneous structures reapproximated using Vicryl. The skin was reapproximated using Nylon. A sterile compressive dressing was applied to therightfoot. The pneumatic ankle tourniquet was deflated and a prompt hyperemic response was noted to all digits of therightfoot.  Patient was transferred to PACU with vital sign stable.

## 2018-10-04 NOTE — H&P (Signed)
.   HISTORY AND PHYSICAL INTERVAL NOTE:  10/04/2018  8:01 AM  Mark Wiggins  has presented today for surgery, with the diagnosis of hallux limitus right foot, osteoarthritis of right great toe.  The various methods of treatment have been discussed with the patient.  No guarantees were given.  After consideration of risks, benefits and other options for treatment, the patient has consented to surgery.  I have reviewed the patients' chart and labs.    Patient Vitals for the past 24 hrs:  BP Temp Temp src Resp SpO2  10/04/18 0711 137/89 97.9 F (36.6 C) Oral 18 97 %    A history and physical examination was performed in my office.  The patient was reexamined.  There have been no changes to this history and physical examination.  Tyson Babinski, DPM

## 2018-10-04 NOTE — Discharge Instructions (Signed)
.These instructions will give you an idea of what to expect after surgery and how to manage issues that may arise before your first post op office visit. ° °Pain Management °Pain is best managed by “staying ahead” of it. If pain gets out of control, it is difficult to get it back under control. Local anesthesia that lasts 6-8 hours is used to numb the foot and decrease pain.  For the best pain control, take the pain medication every 4 hours for the first 2 days post op. On the third day pain medication can be taken as needed.  ° °Post Op Nausea °Nausea is common after surgery, so it is managed proactively.  °If prescribed, use the prescribed nausea medication regularly for the first 2 days post op. ° °Bandages °Do not worry if there is blood on the bandage. What looks like a lot of blood on the bandage is actually a small amount. Blood on the dressing spreads out as it is absorbed by the gauze, the same way a drop of water spreads out on a paper towel.  °If the bandages feel wet or dry, stiff and uncomfortable, call the office during office hours and we will schedule a time for you to have the bandage changed.  °Unless you are specifically told otherwise, we will do the first bandage change in the office.  °Keep your bandage dry. If the bandage becomes wet or soiled, notify the office and we will schedule a time to change the bandage. ° °Activity °It is best to spend most of the first 2 days after surgery lying down with the foot elevated above the level of your heart. °You may put weight on your heel while wearing the surgical shoe.   °You may only get up to go to the restroom. ° °Driving °Do not drive until you are able to respond in an emergency (i.e. slam on the brakes). This usually occurs after the bone has healed - 6 to 8 weeks. ° °Call the Office °If you have a fever over 101°F.  °If you have increasing pain after the initial post op pain has settled down.  °If you have increasing redness, swelling, or  drainage.  °If you have any questions or concerns.  ° ° °General Anesthesia, Adult, Care After °This sheet gives you information about how to care for yourself after your procedure. Your health care provider may also give you more specific instructions. If you have problems or questions, contact your health care provider. °What can I expect after the procedure? °After the procedure, the following side effects are common: °· Pain or discomfort at the IV site. °· Nausea. °· Vomiting. °· Sore throat. °· Trouble concentrating. °· Feeling cold or chills. °· Weak or tired. °· Sleepiness and fatigue. °· Soreness and body aches. These side effects can affect parts of the body that were not involved in surgery. °Follow these instructions at home: ° °For at least 24 hours after the procedure: °· Have a responsible adult stay with you. It is important to have someone help care for you until you are awake and alert. °· Rest as needed. °· Do not: °? Participate in activities in which you could fall or become injured. °? Drive. °? Use heavy machinery. °? Drink alcohol. °? Take sleeping pills or medicines that cause drowsiness. °? Make important decisions or sign legal documents. °? Take care of children on your own. °Eating and drinking °· Follow any instructions from your health care provider about eating   or drinking restrictions. °· When you feel hungry, start by eating small amounts of foods that are soft and easy to digest (bland), such as toast. Gradually return to your regular diet. °· Drink enough fluid to keep your urine pale yellow. °· If you vomit, rehydrate by drinking water, juice, or clear broth. °General instructions °· If you have sleep apnea, surgery and certain medicines can increase your risk for breathing problems. Follow instructions from your health care provider about wearing your sleep device: °? Anytime you are sleeping, including during daytime naps. °? While taking prescription pain medicines, sleeping  medicines, or medicines that make you drowsy. °· Return to your normal activities as told by your health care provider. Ask your health care provider what activities are safe for you. °· Take over-the-counter and prescription medicines only as told by your health care provider. °· If you smoke, do not smoke without supervision. °· Keep all follow-up visits as told by your health care provider. This is important. °Contact a health care provider if: °· You have nausea or vomiting that does not get better with medicine. °· You cannot eat or drink without vomiting. °· You have pain that does not get better with medicine. °· You are unable to pass urine. °· You develop a skin rash. °· You have a fever. °· You have redness around your IV site that gets worse. °Get help right away if: °· You have difficulty breathing. °· You have chest pain. °· You have blood in your urine or stool, or you vomit blood. °Summary °· After the procedure, it is common to have a sore throat or nausea. It is also common to feel tired. °· Have a responsible adult stay with you for the first 24 hours after general anesthesia. It is important to have someone help care for you until you are awake and alert. °· When you feel hungry, start by eating small amounts of foods that are soft and easy to digest (bland), such as toast. Gradually return to your regular diet. °· Drink enough fluid to keep your urine pale yellow. °· Return to your normal activities as told by your health care provider. Ask your health care provider what activities are safe for you. °This information is not intended to replace advice given to you by your health care provider. Make sure you discuss any questions you have with your health care provider. °Document Released: 04/05/2000 Document Revised: 12/31/2016 Document Reviewed: 08/13/2016 °Elsevier Patient Education © 2020 [employer] ° °

## 2018-10-04 NOTE — Anesthesia Postprocedure Evaluation (Signed)
Anesthesia Post Note  Patient: Mark Wiggins  Procedure(s) Performed: FUSION OF RIGHT GREAT TOE (Right First Toe)  Patient location during evaluation: PACU Anesthesia Type: MAC Level of consciousness: awake and alert, oriented and patient cooperative Pain management: pain level controlled Vital Signs Assessment: post-procedure vital signs reviewed and stable Respiratory status: spontaneous breathing Cardiovascular status: stable Postop Assessment: no apparent nausea or vomiting Anesthetic complications: no     Last Vitals:  Vitals:   10/04/18 0711  BP: 137/89  Resp: 18  Temp: 36.6 C  SpO2: 97%    Last Pain:  Vitals:   10/04/18 0711  TempSrc: Oral  PainSc: 3                  ADAMS, AMY A

## 2018-10-04 NOTE — Brief Op Note (Signed)
10/04/2018  10:52 AM  PATIENT:  Mark Wiggins  48 y.o. male  PRE-OPERATIVE DIAGNOSIS:  hallux limitus right foot, osteoarthritis of right great toe  POST-OPERATIVE DIAGNOSIS:  hallux limitus right foot, osteoarthritis of right great toe  PROCEDURE:  Procedure(s): FUSION OF RIGHT GREAT TOE (Right)  SURGEON:  Surgeon(s) and Role:    * Tyson Babinski, DPM - Primary   PHYSICIAN ASSISTANT:    Caprice Beaver, DPM - Assisting    ANESTHESIA:   local and MAC  EBL:  5 mL   BLOOD ADMINISTERED:none  DRAINS: none   LOCAL MEDICATIONS USED:  MARCAINE   , LIDOCAINE  and Amount: 20 ml  SPECIMEN:  No Specimen  DISPOSITION OF SPECIMEN:  N/A  COUNTS:  YES  TOURNIQUET:   Total Tourniquet Time Documented: Calf (Right) - 131 minutes Total: Calf (Right) - 131 minutes   DICTATION: .Viviann Spare Dictation  PLAN OF CARE: Discharge to home after PACU  PATIENT DISPOSITION:  PACU - hemodynamically stable.   Delay start of Pharmacological VTE agent (>24hrs) due to surgical blood loss or risk of bleeding: not applicable

## 2018-10-04 NOTE — Transfer of Care (Signed)
Immediate Anesthesia Transfer of Care Note  Patient: Shaunn Mihok  Procedure(s) Performed: FUSION OF RIGHT GREAT TOE (Right First Toe)  Patient Location: PACU  Anesthesia Type:General  Level of Consciousness: awake, alert , oriented and patient cooperative  Airway & Oxygen Therapy: Patient Spontanous Breathing  Post-op Assessment: Report given to RN and Post -op Vital signs reviewed and stable  Post vital signs: Reviewed and stable  Last Vitals:  Vitals Value Taken Time  BP 120/86 10/04/18 1050  Temp    Pulse 88 10/04/18 1052  Resp 16 10/04/18 1052  SpO2 95 % 10/04/18 1052  Vitals shown include unvalidated device data.  Last Pain:  Vitals:   10/04/18 0711  TempSrc: Oral  PainSc: 3          Complications: No apparent anesthesia complications

## 2018-10-04 NOTE — Anesthesia Procedure Notes (Signed)
Procedure Name: MAC Date/Time: 10/04/2018 8:13 AM Performed by: Andree Elk Amy A, CRNA Pre-anesthesia Checklist: Patient identified, Suction available, Emergency Drugs available, Patient being monitored and Timeout performed Oxygen Delivery Method: Non-rebreather mask

## 2018-10-04 NOTE — Anesthesia Preprocedure Evaluation (Signed)
Anesthesia Evaluation  Patient identified by MRN, date of birth, ID band Patient awake    Reviewed: Allergy & Precautions, NPO status , Patient's Chart, lab work & pertinent test results, reviewed documented beta blocker date and time   History of Anesthesia Complications (+) PONV  Airway Mallampati: III  TM Distance: >3 FB Neck ROM: Full    Dental no notable dental hx. (+) Teeth Intact   Pulmonary asthma ,  H/o carcinoid lung tumor removed 2014. Reports asthma -denied OSA when questioned   Pulmonary exam normal breath sounds clear to auscultation       Cardiovascular Exercise Tolerance: Good hypertension, Pt. on medications and Pt. on home beta blockers negative cardio ROS Normal cardiovascular examI Rhythm:Regular Rate:Normal     Neuro/Psych Anxiety  Neuromuscular disease negative psych ROS   GI/Hepatic negative GI ROS, Neg liver ROS,   Endo/Other  negative endocrine ROSdiabetes, Type 2, Oral Hypoglycemic Agents  Renal/GU negative Renal ROS  negative genitourinary   Musculoskeletal negative musculoskeletal ROS (+)   Abdominal   Peds negative pediatric ROS (+)  Hematology negative hematology ROS (+)   Anesthesia Other Findings   Reproductive/Obstetrics negative OB ROS                             Anesthesia Physical Anesthesia Plan  ASA: III  Anesthesia Plan: MAC   Post-op Pain Management:    Induction: Intravenous  PONV Risk Score and Plan: 3 and Ondansetron, Propofol infusion, TIVA and Treatment may vary due to age or medical condition  Airway Management Planned: Nasal Cannula and Simple Face Mask  Additional Equipment:   Intra-op Plan:   Post-operative Plan: Extubation in OR  Informed Consent: I have reviewed the patients History and Physical, chart, labs and discussed the procedure including the risks, benefits and alternatives for the proposed anesthesia with the  patient or authorized representative who has indicated his/her understanding and acceptance.     Dental advisory given  Plan Discussed with: CRNA  Anesthesia Plan Comments: (Plan Full PPE use  Plan GA with GETA as needed d/w pt -WTP with same after Q&A )        Anesthesia Quick Evaluation

## 2018-10-10 ENCOUNTER — Encounter (HOSPITAL_COMMUNITY): Payer: Self-pay | Admitting: Podiatry

## 2019-06-26 ENCOUNTER — Other Ambulatory Visit (HOSPITAL_COMMUNITY): Payer: BC Managed Care – PPO

## 2019-06-27 ENCOUNTER — Ambulatory Visit: Admit: 2019-06-27 | Payer: BC Managed Care – PPO

## 2019-06-27 SURGERY — REMOVAL, HARDWARE
Anesthesia: Monitor Anesthesia Care | Site: Foot | Laterality: Right

## 2020-04-18 ENCOUNTER — Other Ambulatory Visit: Payer: Self-pay | Admitting: Podiatry

## 2020-04-18 DIAGNOSIS — M79671 Pain in right foot: Secondary | ICD-10-CM

## 2020-05-01 NOTE — Patient Instructions (Signed)
Mark Wiggins  05/01/2020     @PREFPERIOPPHARMACY @   Your procedure is scheduled on  05/07/2020.   Report to [facility] at  1100  A.M.   Call this number if you have problems the morning of surgery:  [phone]   Remember:  Do not eat or drink after midnight.                        Take these medicines the morning of surgery with A SIP OF WATER  Amlodpine, zyrtec, lexapro, prilosec.  Use your inhalers before you come and bring your rescue inhaler with you.  DO NOT take any medications for diabetes the morning of your procedure.   Place clean sheets on your bed the night before your procedure and DO NOT sleep with pets this night.  Shower with CHG the night before and the morning of your procedure. DO NOT use CHG on your face, hair or genitals.  After each shower, dry off with a clean towel, put on clean, comfortable clothes and brush your teeth.      Do not wear jewelry, make-up or nail polish.  Do not wear lotions, powders, or perfumes, or deodorant.  Do not shave 48 hours prior to surgery.  Men may shave face and neck.  Do not bring valuables to the hospital.  [facility] is not responsible for any belongings or valuables.  If your glucose is 70 or below the morning of your procedure, drink 1/2 cup of clear liquid that has sugar in it and recheck your glucose in 15 minutes. If your glucose is still 70 or below, call [phone] for instructions.    If your glucose is 300 or above the morning of your procedure, call [phone] for instructions.     Contacts, dentures or bridgework may not be worn into surgery.  Leave your suitcase in the car.  After surgery it may be brought to your room.  For patients admitted to the hospital, discharge time will be determined by your treatment team.  Patients discharged the day of surgery will not be allowed to drive home and must have someone with them for 24 hours.     Special instructions:  DO NOT smoke  tobacco or vape for 24 hours before your procedure.  Please read over the following fact sheets that you were given. Pain Booklet, Coughing and Deep Breathing, Surgical Site Infection Prevention, Anesthesia Post-op Instructions and Care and Recovery After Surgery       Orthopedic Hardware Removal, Care After This sheet gives you information about how to care for yourself after your procedure. Your health care provider may also give you more specific instructions. If you have problems or questions, contact your health care provider. What can I expect after the procedure? After the procedure, it is common to have:  Soreness or pain.  Some swelling in the area where the hardware was removed.  A small amount of blood or clear fluid coming from your incision. Follow these instructions at home: If you have a cast:  Do not stick anything inside the cast to scratch your skin. Doing that increases your risk of infection.  Check the skin around the cast every day. Tell your health care provider about any concerns.  You may put lotion on dry skin around the edges of the cast. Do not put lotion on the skin underneath the cast.  Keep the cast clean and dry. If  you have a splint or boot:  Wear the splint or boot as told by your health care provider. Remove it only as told by your health care provider.  Loosen the splint or boot if your fingers or toes tingle, become numb, or turn cold and blue.  Keep the splint or boot clean and dry. Bathing  Do not take baths, swim, or use a hot tub until your health care provider approves. Ask your health care provider if you may take showers. You may only be allowed to take sponge baths.  Keep the bandage (dressing) dry until your health care provider says it can be removed.  If your cast, splint, or boot is not waterproof: ? Do not let it get wet. ? Cover it with a watertight covering when you take a bath or a shower. Incision care  Follow  instructions from your health care provider about how to take care of your incision. Make sure you: ? Wash your hands with soap and water before you change your dressing. If soap and water are not available, use hand sanitizer. ? Change your dressing as told by your health care provider. ? Leave stitches (sutures), skin glue, or adhesive strips in place. These skin closures may need to stay in place for 2 weeks or longer. If adhesive strip edges start to loosen and curl up, you may trim the loose edges. Do not remove adhesive strips completely unless your health care provider tells you to do that.  Check your incision area every day for signs of infection. Check for: ? Redness. ? More swelling or pain. ? More fluid or blood. ? Warmth. ? Pus or a bad smell.   Managing pain, stiffness, and swelling  If directed, put ice on the affected area: ? If you have a removable splint or boot, remove it as told by your health care provider. ? Put ice in a plastic bag. ? Place a towel between your skin and the bag. ? Leave the ice on for 20 minutes, 2-3 times a day.  Move your fingers or toes often to avoid stiffness and to lessen swelling.  Raise (elevate) the injured area above the level of your heart while you are sitting or lying down.   Driving  Do not drive or use heavy machinery while taking prescription pain medicine.  Do not drive for 24 hours if you were given a medicine to help you relax (sedative) during your procedure.  Ask your health care provider when it is safe to drive if you have a cast, splint, or boot on the affected limb. Activity  Ask your health care provider what activities are safe for you during recovery, and ask what activities you need to avoid.  Do not use the injured limb to support your body weight until your health care provider says that you can.  Do not play contact sports until your health care provider approves.  Do exercises as told by your health care  provider.  Avoid sitting for a long time without moving. Get up and move around at least every few hours. This will help prevent blood clots. General instructions  Do not put pressure on any part of the cast or splint until it is fully hardened. This may take several hours.  If you are taking prescription pain medicine, take actions to prevent or treat constipation. Your health care provider may recommend that you: ? Drink enough fluid to keep your urine pale yellow. ? Eat foods that are  high in fiber, such as fresh fruits and vegetables, whole grains, and beans. ? Limit foods that are high in fat and processed sugars, such as fried or sweet foods. ? Take an over-the-counter or prescription medicine for constipation.  Do not use any products that contain nicotine or tobacco, such as cigarettes and e-cigarettes. These can delay bone healing after surgery. If you need help quitting, ask your health care provider.  Take over-the-counter and prescription medicines only as told by your health care provider.  Keep all follow-up visits as told by your health care provider. This is important. Contact a health care provider if:  You have lasting pain.  You have redness around your incision.  You have more swelling or pain around your incision.  You have more fluid or blood coming from your incision.  Your incision feels warm to the touch.  You have pus or a bad smell coming from your incision.  You are unable to do exercises or physical activity as told by your health care provider. Get help right away if:  You have difficulty breathing.  You have chest pain.  You have severe pain.  You have a fever or chills.  You have numbness for more than 24 hours in the area where the hardware was removed. Summary  After the procedure, it is common to have some pain and swelling in the area where the hardware was removed.  Follow instructions from your health care provider about how to  take care of your incision.  Return to your normal activities as told by your health care provider. Ask your health care provider what activities are safe for you. This information is not intended to replace advice given to you by your health care provider. Make sure you discuss any questions you have with your health care provider. Document Revised: 02/23/2018 Document Reviewed: 01/20/2017 Elsevier Patient Education  2021 [place] After This sheet gives you information about how to care for yourself after your procedure. Your health care provider may also give you more specific instructions. If you have problems or questions, contact your health care provider. What can I expect after the procedure? After the procedure, it is common to have:  Tiredness.  Forgetfulness about what happened after the procedure.  Impaired judgment for important decisions.  Nausea or vomiting.  Some difficulty with balance. Follow these instructions at home: For the time period you were told by your health care provider:  Rest as needed.  Do not participate in activities where you could fall or become injured.  Do not drive or use machinery.  Do not drink alcohol.  Do not take sleeping pills or medicines that cause drowsiness.  Do not make important decisions or sign legal documents.  Do not take care of children on your own.      Eating and drinking  Follow the diet that is recommended by your health care provider.  Drink enough fluid to keep your urine pale yellow.  If you vomit: ? Drink water, juice, or soup when you can drink without vomiting. ? Make sure you have little or no nausea before eating solid foods. General instructions  Have a responsible adult stay with you for the time you are told. It is important to have someone help care for you until you are awake and alert.  Take over-the-counter and prescription medicines only as told by your  health care provider.  If you have sleep apnea, surgery and certain medicines  can increase your risk for breathing problems. Follow instructions from your health care provider about wearing your sleep device: ? Anytime you are sleeping, including during daytime naps. ? While taking prescription pain medicines, sleeping medicines, or medicines that make you drowsy.  Avoid smoking.  Keep all follow-up visits as told by your health care provider. This is important. Contact a health care provider if:  You keep feeling nauseous or you keep vomiting.  You feel light-headed.  You are still sleepy or having trouble with balance after 24 hours.  You develop a rash.  You have a fever.  You have redness or swelling around the IV site. Get help right away if:  You have trouble breathing.  You have new-onset confusion at home. Summary  For several hours after your procedure, you may feel tired. You may also be forgetful and have poor judgment.  Have a responsible adult stay with you for the time you are told. It is important to have someone help care for you until you are awake and alert.  Rest as told. Do not drive or operate machinery. Do not drink alcohol or take sleeping pills.  Get help right away if you have trouble breathing, or if you suddenly become confused. This information is not intended to replace advice given to you by your health care provider. Make sure you discuss any questions you have with your health care provider. Document Revised: 09/13/2019 Document Reviewed: 11/30/2018 Elsevier Patient Education  2021 [employer].

## 2020-05-05 ENCOUNTER — Encounter (HOSPITAL_COMMUNITY)
Admission: RE | Admit: 2020-05-05 | Discharge: 2020-05-05 | Disposition: A | Discharge status: Home / Self Care | Payer: BC Managed Care – PPO | Source: Ambulatory Visit | Attending: Podiatry | Admitting: Podiatry

## 2020-05-05 ENCOUNTER — Other Ambulatory Visit: Payer: Self-pay

## 2020-05-05 ENCOUNTER — Ambulatory Visit (HOSPITAL_COMMUNITY)
Admission: RE | Admit: 2020-05-05 | Discharge: 2020-05-05 | Disposition: A | Discharge status: Home / Self Care | Payer: BC Managed Care – PPO | Source: Ambulatory Visit | Attending: Podiatry | Admitting: Podiatry

## 2020-05-05 ENCOUNTER — Other Ambulatory Visit (HOSPITAL_COMMUNITY)
Admission: RE | Admit: 2020-05-05 | Discharge: 2020-05-05 | Disposition: A | Discharge status: Home / Self Care | Payer: BC Managed Care – PPO | Source: Ambulatory Visit | Attending: Podiatry | Admitting: Podiatry

## 2020-05-05 ENCOUNTER — Encounter (HOSPITAL_COMMUNITY): Payer: Self-pay

## 2020-05-05 DIAGNOSIS — Z20822 Contact with and (suspected) exposure to covid-19: Secondary | ICD-10-CM | POA: Insufficient documentation

## 2020-05-05 DIAGNOSIS — Z882 Allergy status to sulfonamides status: Secondary | ICD-10-CM | POA: Diagnosis not present

## 2020-05-05 DIAGNOSIS — Z01818 Encounter for other preprocedural examination: Secondary | ICD-10-CM | POA: Insufficient documentation

## 2020-05-05 DIAGNOSIS — Z981 Arthrodesis status: Secondary | ICD-10-CM | POA: Diagnosis not present

## 2020-05-05 DIAGNOSIS — Z833 Family history of diabetes mellitus: Secondary | ICD-10-CM | POA: Diagnosis not present

## 2020-05-05 DIAGNOSIS — K219 Gastro-esophageal reflux disease without esophagitis: Secondary | ICD-10-CM | POA: Diagnosis not present

## 2020-05-05 DIAGNOSIS — M79671 Pain in right foot: Secondary | ICD-10-CM

## 2020-05-05 DIAGNOSIS — Z8249 Family history of ischemic heart disease and other diseases of the circulatory system: Secondary | ICD-10-CM | POA: Diagnosis not present

## 2020-05-05 DIAGNOSIS — Z85118 Personal history of other malignant neoplasm of bronchus and lung: Secondary | ICD-10-CM | POA: Diagnosis not present

## 2020-05-05 DIAGNOSIS — Z7984 Long term (current) use of oral hypoglycemic drugs: Secondary | ICD-10-CM | POA: Diagnosis not present

## 2020-05-05 DIAGNOSIS — J449 Chronic obstructive pulmonary disease, unspecified: Secondary | ICD-10-CM | POA: Diagnosis not present

## 2020-05-05 DIAGNOSIS — Z91041 Radiographic dye allergy status: Secondary | ICD-10-CM | POA: Diagnosis not present

## 2020-05-05 DIAGNOSIS — Y939 Activity, unspecified: Secondary | ICD-10-CM | POA: Diagnosis not present

## 2020-05-05 DIAGNOSIS — X58XXXA Exposure to other specified factors, initial encounter: Secondary | ICD-10-CM | POA: Diagnosis not present

## 2020-05-05 DIAGNOSIS — Z79899 Other long term (current) drug therapy: Secondary | ICD-10-CM | POA: Diagnosis not present

## 2020-05-05 DIAGNOSIS — E119 Type 2 diabetes mellitus without complications: Secondary | ICD-10-CM | POA: Diagnosis not present

## 2020-05-05 DIAGNOSIS — I10 Essential (primary) hypertension: Secondary | ICD-10-CM | POA: Diagnosis not present

## 2020-05-05 DIAGNOSIS — T8484XA Pain due to internal orthopedic prosthetic devices, implants and grafts, initial encounter: Secondary | ICD-10-CM | POA: Diagnosis not present

## 2020-05-05 DIAGNOSIS — Z880 Allergy status to penicillin: Secondary | ICD-10-CM | POA: Diagnosis not present

## 2020-05-05 LAB — BASIC METABOLIC PANEL
Anion gap: 11 (ref 5–15)
BUN: 13 mg/dL (ref 6–20)
CO2: 22 mmol/L (ref 22–32)
Calcium: 9 mg/dL (ref 8.9–10.3)
Chloride: 99 mmol/L (ref 98–111)
Creatinine, Ser: 0.82 mg/dL (ref 0.61–1.24)
GFR, Estimated: >60 mL/min (ref >60)
Glucose, Bld: 142 mg/dL — ABNORMAL HIGH (ref 70–99)
Potassium: 3.8 mmol/L (ref 3.5–5.1)
Sodium: 132 mmol/L — ABNORMAL LOW (ref 135–145)

## 2020-05-05 LAB — CBC WITH DIFFERENTIAL/PLATELET
Abs Immature Granulocytes: 0.05 10*3/uL (ref 0.00–0.07)
Basophils Absolute: 0.1 10*3/uL (ref 0.0–0.1)
Basophils Relative: 1 %
Eosinophils Absolute: 0.1 10*3/uL (ref 0.0–0.5)
Eosinophils Relative: 1 %
HCT: 41.9 % (ref 39.0–52.0)
Hemoglobin: 14.1 g/dL (ref 13.0–17.0)
Immature Granulocytes: 1 %
Lymphocytes Relative: 21 %
Lymphs Abs: 2.3 10*3/uL (ref 0.7–4.0)
MCH: 30.7 pg (ref 26.0–34.0)
MCHC: 33.7 g/dL (ref 30.0–36.0)
MCV: 91.3 fL (ref 80.0–100.0)
Monocytes Absolute: 0.8 10*3/uL (ref 0.1–1.0)
Monocytes Relative: 7 %
Neutro Abs: 7.7 10*3/uL (ref 1.7–7.7)
Neutrophils Relative %: 69 %
Platelets: 268 10*3/uL (ref 150–400)
RBC: 4.59 MIL/uL (ref 4.22–5.81)
RDW: 13.6 % (ref 11.5–15.5)
WBC: 11 10*3/uL — ABNORMAL HIGH (ref 4.0–10.5)
nRBC: 0 % (ref 0.0–0.2)

## 2020-05-05 LAB — HEMOGLOBIN A1C
Hgb A1c MFr Bld: 7.5 % — ABNORMAL HIGH (ref 4.8–5.6)
Mean Plasma Glucose: 168.55 mg/dL

## 2020-05-05 LAB — SARS CORONAVIRUS 2 (TAT 6-24 HRS): SARS Coronavirus 2: NEGATIVE

## 2020-05-07 ENCOUNTER — Ambulatory Visit (HOSPITAL_COMMUNITY): Payer: BC Managed Care – PPO | Admitting: Anesthesiology

## 2020-05-07 ENCOUNTER — Other Ambulatory Visit: Payer: Self-pay

## 2020-05-07 ENCOUNTER — Ambulatory Visit (HOSPITAL_COMMUNITY)
Admission: RE | Admit: 2020-05-07 | Discharge: 2020-05-07 | Disposition: A | Discharge status: Home / Self Care | Payer: BC Managed Care – PPO | Attending: Podiatry | Admitting: Podiatry

## 2020-05-07 ENCOUNTER — Ambulatory Visit (HOSPITAL_COMMUNITY): Payer: BC Managed Care – PPO

## 2020-05-07 ENCOUNTER — Encounter (HOSPITAL_COMMUNITY): Payer: Self-pay

## 2020-05-07 ENCOUNTER — Encounter (HOSPITAL_COMMUNITY)
Admission: RE | Disposition: A | Discharge status: Home / Self Care | Payer: Self-pay | Source: Home / Self Care | Attending: Podiatry

## 2020-05-07 DIAGNOSIS — Y939 Activity, unspecified: Secondary | ICD-10-CM | POA: Insufficient documentation

## 2020-05-07 DIAGNOSIS — Z880 Allergy status to penicillin: Secondary | ICD-10-CM | POA: Insufficient documentation

## 2020-05-07 DIAGNOSIS — T8484XA Pain due to internal orthopedic prosthetic devices, implants and grafts, initial encounter: Secondary | ICD-10-CM | POA: Diagnosis not present

## 2020-05-07 DIAGNOSIS — Z833 Family history of diabetes mellitus: Secondary | ICD-10-CM | POA: Insufficient documentation

## 2020-05-07 DIAGNOSIS — Z882 Allergy status to sulfonamides status: Secondary | ICD-10-CM | POA: Insufficient documentation

## 2020-05-07 DIAGNOSIS — Z85118 Personal history of other malignant neoplasm of bronchus and lung: Secondary | ICD-10-CM | POA: Insufficient documentation

## 2020-05-07 DIAGNOSIS — Z91041 Radiographic dye allergy status: Secondary | ICD-10-CM | POA: Insufficient documentation

## 2020-05-07 DIAGNOSIS — Z981 Arthrodesis status: Secondary | ICD-10-CM | POA: Insufficient documentation

## 2020-05-07 DIAGNOSIS — J449 Chronic obstructive pulmonary disease, unspecified: Secondary | ICD-10-CM | POA: Insufficient documentation

## 2020-05-07 DIAGNOSIS — K219 Gastro-esophageal reflux disease without esophagitis: Secondary | ICD-10-CM | POA: Insufficient documentation

## 2020-05-07 DIAGNOSIS — Z9889 Other specified postprocedural states: Secondary | ICD-10-CM

## 2020-05-07 DIAGNOSIS — E119 Type 2 diabetes mellitus without complications: Secondary | ICD-10-CM | POA: Insufficient documentation

## 2020-05-07 DIAGNOSIS — Z79899 Other long term (current) drug therapy: Secondary | ICD-10-CM | POA: Insufficient documentation

## 2020-05-07 DIAGNOSIS — X58XXXA Exposure to other specified factors, initial encounter: Secondary | ICD-10-CM | POA: Insufficient documentation

## 2020-05-07 DIAGNOSIS — Z7984 Long term (current) use of oral hypoglycemic drugs: Secondary | ICD-10-CM | POA: Insufficient documentation

## 2020-05-07 DIAGNOSIS — I10 Essential (primary) hypertension: Secondary | ICD-10-CM | POA: Insufficient documentation

## 2020-05-07 DIAGNOSIS — Z20822 Contact with and (suspected) exposure to covid-19: Secondary | ICD-10-CM | POA: Insufficient documentation

## 2020-05-07 DIAGNOSIS — Z8249 Family history of ischemic heart disease and other diseases of the circulatory system: Secondary | ICD-10-CM | POA: Insufficient documentation

## 2020-05-07 HISTORY — PX: HARDWARE REMOVAL: SHX979

## 2020-05-07 HISTORY — PX: ARTHRODESIS METATARSAL: SHX6565

## 2020-05-07 LAB — GLUCOSE, CAPILLARY
Glucose-Capillary: 124 mg/dL — ABNORMAL HIGH (ref 70–99)
Glucose-Capillary: 126 mg/dL — ABNORMAL HIGH (ref 70–99)

## 2020-05-07 SURGERY — REMOVAL, HARDWARE
Anesthesia: General | Site: Toe | Laterality: Right

## 2020-05-07 MED ORDER — LIDOCAINE HCL (PF) 1 % IJ SOLN
INTRAMUSCULAR | Status: AC
  Filled 2020-05-07: qty 30

## 2020-05-07 MED ORDER — ORAL CARE MOUTH RINSE: 15.0000 mL | Freq: Once | OROMUCOSAL | Status: AC

## 2020-05-07 MED ORDER — PHENYLEPHRINE HCL (PRESSORS) 10 MG/ML IV SOLN
INTRAVENOUS | Status: DC | PRN
  Administered 2020-05-07: 100 ug via INTRAVENOUS
  Administered 2020-05-07: 120 ug via INTRAVENOUS
  Administered 2020-05-07: 40 ug via INTRAVENOUS
  Administered 2020-05-07: 80 ug via INTRAVENOUS
  Administered 2020-05-07 (×2): 100 ug via INTRAVENOUS
  Administered 2020-05-07 (×2): 80 ug via INTRAVENOUS
  Administered 2020-05-07 (×2): 100 ug via INTRAVENOUS

## 2020-05-07 MED ORDER — KETAMINE HCL 50 MG/5ML IJ SOSY
PREFILLED_SYRINGE | INTRAMUSCULAR | Status: AC
  Filled 2020-05-07: qty 5

## 2020-05-07 MED ORDER — CHLORHEXIDINE GLUCONATE CLOTH 2 % EX PADS: 6.0000 | MEDICATED_PAD | Freq: Once | CUTANEOUS | Status: DC

## 2020-05-07 MED ORDER — LIDOCAINE HCL 1 % IJ SOLN
INTRAMUSCULAR | Status: DC | PRN
  Administered 2020-05-07: 10 mL via INTRAMUSCULAR

## 2020-05-07 MED ORDER — EPHEDRINE SULFATE 50 MG/ML IJ SOLN
INTRAMUSCULAR | Status: DC | PRN
  Administered 2020-05-07: 5 mg via INTRAVENOUS

## 2020-05-07 MED ORDER — KETAMINE HCL 10 MG/ML IJ SOLN
INTRAMUSCULAR | Status: DC | PRN
  Administered 2020-05-07: 20 mg via INTRAVENOUS
  Administered 2020-05-07: 10 mg via INTRAVENOUS
  Administered 2020-05-07: 20 mg via INTRAVENOUS

## 2020-05-07 MED ORDER — SCOPOLAMINE 1 MG/3DAYS TD PT72
1.0000 | MEDICATED_PATCH | Freq: Once | TRANSDERMAL | Status: DC
  Administered 2020-05-07: 1.5 mg via TRANSDERMAL

## 2020-05-07 MED ORDER — PROPOFOL 10 MG/ML IV BOLUS
INTRAVENOUS | Status: DC | PRN
  Administered 2020-05-07: 100 mg via INTRAVENOUS
  Administered 2020-05-07: 50 mg via INTRAVENOUS
  Administered 2020-05-07: 250 mg via INTRAVENOUS

## 2020-05-07 MED ORDER — LIDOCAINE HCL (PF) 2 % IJ SOLN
INTRAMUSCULAR | Status: AC
  Filled 2020-05-07: qty 5

## 2020-05-07 MED ORDER — FENTANYL CITRATE (PF) 250 MCG/5ML IJ SOLN
INTRAMUSCULAR | Status: DC | PRN
  Administered 2020-05-07: 50 ug via INTRAVENOUS
  Administered 2020-05-07 (×3): 25 ug via INTRAVENOUS
  Administered 2020-05-07 (×2): 50 ug via INTRAVENOUS
  Administered 2020-05-07 (×3): 25 ug via INTRAVENOUS

## 2020-05-07 MED ORDER — PROPOFOL 10 MG/ML IV BOLUS
INTRAVENOUS | Status: AC
  Filled 2020-05-07: qty 20

## 2020-05-07 MED ORDER — SCOPOLAMINE 1 MG/3DAYS TD PT72
MEDICATED_PATCH | TRANSDERMAL | Status: AC
  Filled 2020-05-07: qty 1

## 2020-05-07 MED ORDER — PROPOFOL 10 MG/ML IV BOLUS
INTRAVENOUS | Status: AC
  Filled 2020-05-07: qty 40

## 2020-05-07 MED ORDER — PHENYLEPHRINE 40 MCG/ML (10ML) SYRINGE FOR IV PUSH (FOR BLOOD PRESSURE SUPPORT)
PREFILLED_SYRINGE | INTRAVENOUS | Status: AC
  Filled 2020-05-07: qty 10

## 2020-05-07 MED ORDER — MEPERIDINE HCL 50 MG/ML IJ SOLN: 6.2500 mg | INTRAMUSCULAR | Status: DC | PRN

## 2020-05-07 MED ORDER — FENTANYL CITRATE (PF) 100 MCG/2ML IJ SOLN
INTRAMUSCULAR | Status: AC
  Filled 2020-05-07: qty 2

## 2020-05-07 MED ORDER — CLINDAMYCIN PHOSPHATE 600 MG/50ML IV SOLN
600.0000 mg | Freq: Once | INTRAVENOUS | Status: AC
  Administered 2020-05-07: 600 mg via INTRAVENOUS

## 2020-05-07 MED ORDER — METOCLOPRAMIDE HCL 5 MG/ML IJ SOLN
INTRAMUSCULAR | Status: AC
  Filled 2020-05-07: qty 2

## 2020-05-07 MED ORDER — PROPOFOL 500 MG/50ML IV EMUL
INTRAVENOUS | Status: DC | PRN
  Administered 2020-05-07: 25 ug/kg/min via INTRAVENOUS

## 2020-05-07 MED ORDER — CLINDAMYCIN PHOSPHATE 600 MG/50ML IV SOLN
INTRAVENOUS | Status: AC
  Filled 2020-05-07: qty 50

## 2020-05-07 MED ORDER — LACTATED RINGERS IV SOLN: INTRAVENOUS | Status: DC

## 2020-05-07 MED ORDER — CLINDAMYCIN PHOSPHATE 900 MG/50ML IV SOLN: 900.0000 mg | INTRAVENOUS | Status: DC

## 2020-05-07 MED ORDER — KETAMINE HCL 10 MG/ML IJ SOLN
INTRAMUSCULAR | Status: AC
  Filled 2020-05-07: qty 1

## 2020-05-07 MED ORDER — MIDAZOLAM HCL 2 MG/2ML IJ SOLN
INTRAMUSCULAR | Status: DC | PRN
  Administered 2020-05-07: 2 mg via INTRAVENOUS

## 2020-05-07 MED ORDER — CHLORHEXIDINE GLUCONATE 0.12 % MT SOLN
15.0000 mL | Freq: Once | OROMUCOSAL | Status: AC
  Administered 2020-05-07: 15 mL via OROMUCOSAL

## 2020-05-07 MED ORDER — CHLORHEXIDINE GLUCONATE 0.12 % MT SOLN
OROMUCOSAL | Status: AC
  Filled 2020-05-07: qty 15

## 2020-05-07 MED ORDER — LIDOCAINE HCL (CARDIAC) PF 100 MG/5ML IV SOSY
PREFILLED_SYRINGE | INTRAVENOUS | Status: DC | PRN
  Administered 2020-05-07: 60 mg via INTRAVENOUS

## 2020-05-07 MED ORDER — 0.9 % SODIUM CHLORIDE (POUR BTL) OPTIME
TOPICAL | Status: DC | PRN
  Administered 2020-05-07: 1000 mL

## 2020-05-07 MED ORDER — BUPIVACAINE HCL (PF) 0.5 % IJ SOLN
INTRAMUSCULAR | Status: AC
  Filled 2020-05-07: qty 30

## 2020-05-07 MED ORDER — MIDAZOLAM HCL 2 MG/2ML IJ SOLN
INTRAMUSCULAR | Status: AC
  Filled 2020-05-07: qty 2

## 2020-05-07 MED ORDER — HYDROMORPHONE HCL 1 MG/ML IJ SOLN
0.2500 mg | INTRAMUSCULAR | Status: DC | PRN
  Administered 2020-05-07: 0.5 mg via INTRAVENOUS
  Filled 2020-05-07: qty 0.5

## 2020-05-07 MED ORDER — METOCLOPRAMIDE HCL 5 MG/ML IJ SOLN
INTRAMUSCULAR | Status: DC | PRN
  Administered 2020-05-07: 10 mg via INTRAVENOUS

## 2020-05-07 MED ORDER — ONDANSETRON HCL 4 MG/2ML IJ SOLN
INTRAMUSCULAR | Status: AC
  Filled 2020-05-07: qty 2

## 2020-05-07 MED ORDER — ONDANSETRON HCL 4 MG/2ML IJ SOLN: 4.0000 mg | Freq: Once | INTRAMUSCULAR | Status: DC | PRN

## 2020-05-07 MED ORDER — ONDANSETRON HCL 4 MG/2ML IJ SOLN
INTRAMUSCULAR | Status: DC | PRN
  Administered 2020-05-07: 4 mg via INTRAVENOUS

## 2020-05-07 SURGICAL SUPPLY — 63 items
APL PRP STRL LF DISP 70% ISPRP (MISCELLANEOUS) ×2
APL SKNCLS STERI-STRIP NONHPOA (GAUZE/BANDAGES/DRESSINGS) ×2
BANDAGE ELASTIC 4 VELCRO NS (GAUZE/BANDAGES/DRESSINGS) ×2 IMPLANT
BANDAGE ESMARK 4X12 BL STRL LF (DISPOSABLE) ×2 IMPLANT
BENZOIN TINCTURE PRP APPL 2/3 (GAUZE/BANDAGES/DRESSINGS) ×3 IMPLANT
BIT DRILL CANN TWST 3.0 2.1 (BIT) ×2 IMPLANT
BLADE AVERAGE 25X9 (BLADE) ×3 IMPLANT
BLADE CANN SCREWDRIVER 3.0 (BLADE) ×2 IMPLANT
BLADE SURG 15 STRL LF DISP TIS (BLADE) ×2 IMPLANT
BLADE SURG 15 STRL SS (BLADE) ×3
BNDG CMPR 12X4 ELC STRL LF (DISPOSABLE) ×2
BNDG CMPR STD VLCR NS LF 5.8X4 (GAUZE/BANDAGES/DRESSINGS) ×2
BNDG CONFORM 2 STRL LF (GAUZE/BANDAGES/DRESSINGS) ×3 IMPLANT
BNDG ELASTIC 4X5.8 VLCR NS LF (GAUZE/BANDAGES/DRESSINGS) ×3 IMPLANT
BNDG ESMARK 4X12 BLUE STRL LF (DISPOSABLE) ×3
BNDG GAUZE ELAST 4 BULKY (GAUZE/BANDAGES/DRESSINGS) ×3 IMPLANT
BOOT STEPPER DURA XLG (SOFTGOODS) ×2 IMPLANT
CHLORAPREP W/TINT 26 (MISCELLANEOUS) ×3 IMPLANT
CLOTH BEACON ORANGE TIMEOUT ST (SAFETY) ×3 IMPLANT
COVER LIGHT HANDLE STERIS (MISCELLANEOUS) ×6 IMPLANT
COVER WAND RF STERILE (DRAPES) ×3 IMPLANT
CUFF TOURN SGL QUICK 18X4 (TOURNIQUET CUFF) ×3 IMPLANT
DECANTER SPIKE VIAL GLASS SM (MISCELLANEOUS) ×6 IMPLANT
DRAPE HALF SHEET 40X57 (DRAPES) ×3 IMPLANT
DRAPE OEC MINIVIEW 54X84 (DRAPES) ×3 IMPLANT
DRILL BIT CANN TWST 3.0 2.1 (BIT) ×2
DRSG ADAPTIC 3X8 NADH LF (GAUZE/BANDAGES/DRESSINGS) ×3 IMPLANT
ELECT REM PT RETURN 9FT ADLT (ELECTROSURGICAL) ×3
ELECTRODE REM PT RTRN 9FT ADLT (ELECTROSURGICAL) ×2 IMPLANT
GAUZE SPONGE 4X4 12PLY STRL (GAUZE/BANDAGES/DRESSINGS) ×3 IMPLANT
GLOVE EXAM NITRILE MD LF STRL (GLOVE) ×3 IMPLANT
GLOVE SURG ENC MOIS LTX SZ7.5 (GLOVE) ×3 IMPLANT
GLOVE SURG LTX SZ7 (GLOVE) ×6 IMPLANT
GLOVE SURG UNDER POLY LF SZ7 (GLOVE) ×6 IMPLANT
GLOVE SURG UNDER POLY LF SZ7.5 (GLOVE) ×6 IMPLANT
GOWN STRL REUS W/ TWL LRG LVL3 (GOWN DISPOSABLE) ×2 IMPLANT
GOWN STRL REUS W/ TWL XL LVL3 (GOWN DISPOSABLE) ×2 IMPLANT
GOWN STRL REUS W/TWL LRG LVL3 (GOWN DISPOSABLE) ×9 IMPLANT
GOWN STRL REUS W/TWL XL LVL3 (GOWN DISPOSABLE) ×3
K-WIRE 1.1 (WIRE) ×9
K-WIRE FX100X1.1XTROC TIP (WIRE) ×6
KIT TURNOVER KIT A (KITS) ×3 IMPLANT
KWIRE FX100X1.1XTROC TIP (WIRE) ×3 IMPLANT
MANIFOLD NEPTUNE II (INSTRUMENTS) ×3 IMPLANT
NDL HYPO 25X1 1.5 SAFETY (NEEDLE) ×4 IMPLANT
NEEDLE HYPO 25X1 1.5 SAFETY (NEEDLE) ×12 IMPLANT
NS IRRIG 1000ML POUR BTL (IV SOLUTION) ×3 IMPLANT
PACK BASIC LIMB (CUSTOM PROCEDURE TRAY) ×3 IMPLANT
PAD ARMBOARD 7.5X6 YLW CONV (MISCELLANEOUS) ×3 IMPLANT
PUTTY DBX 2.5CC (Putty) ×3 IMPLANT
PUTTY DBX 2.5CC DEPUY (Putty) ×1 IMPLANT
RASP SM TEAR CROSS CUT (RASP) IMPLANT
SCREW CAN COMP ST 3.0X26MM (Screw) ×2 IMPLANT
SCREW CANN 3.0 (Screw) ×4 IMPLANT
SET BASIN LINEN APH (SET/KITS/TRAYS/PACK) ×3 IMPLANT
STRIP CLOSURE SKIN 1/2X4 (GAUZE/BANDAGES/DRESSINGS) ×4 IMPLANT
SUT PROLENE 3 0 PS 2 (SUTURE) ×3 IMPLANT
SUT VIC AB 2-0 CT2 27 (SUTURE) ×3 IMPLANT
SUT VIC AB 3-0 SH 27 (SUTURE) ×3
SUT VIC AB 3-0 SH 27X BRD (SUTURE) ×2 IMPLANT
SUT VIC AB 4-0 PS2 27 (SUTURE) ×3 IMPLANT
SUT VICRYL AB 3-0 FS1 BRD 27IN (SUTURE) IMPLANT
SYR CONTROL 10ML LL (SYRINGE) ×6 IMPLANT

## 2020-05-07 NOTE — Anesthesia Preprocedure Evaluation (Addendum)
Anesthesia Evaluation  Patient identified by MRN, date of birth, ID band Patient awake    Reviewed: Allergy & Precautions, NPO status , Patient's Chart, lab work & pertinent test results  History of Anesthesia Complications (+) PONV and history of anesthetic complications  Airway Mallampati: II  TM Distance: >3 FB Neck ROM: Full    Dental  (+) Dental Advisory Given   Pulmonary sleep apnea (non complaint with CPAP) ,  Right lung cancer   Pulmonary exam normal breath sounds clear to auscultation       Cardiovascular Exercise Tolerance: Good hypertension, Pt. on medications Normal cardiovascular exam Rhythm:Regular Rate:Normal     Neuro/Psych Anxiety  Neuromuscular disease    GI/Hepatic Neg liver ROS, GERD  Medicated,  Endo/Other  diabetes, Well Controlled, Type 2, Oral Hypoglycemic Agents  Renal/GU negative Renal ROS     Musculoskeletal negative musculoskeletal ROS (+)   Abdominal   Peds  Hematology negative hematology ROS (+)   Anesthesia Other Findings   Reproductive/Obstetrics negative OB ROS                             Anesthesia Physical Anesthesia Plan  ASA: III  Anesthesia Plan: General   Post-op Pain Management:    Induction: Intravenous  PONV Risk Score and Plan: 4 or greater and Propofol infusion, Ondansetron, Midazolam and Metaclopromide  Airway Management Planned: LMA  Additional Equipment:   Intra-op Plan:   Post-operative Plan:   Informed Consent: I have reviewed the patients History and Physical, chart, labs and discussed the procedure including the risks, benefits and alternatives for the proposed anesthesia with the patient or authorized representative who has indicated his/her understanding and acceptance.     Dental advisory given  Plan Discussed with: CRNA and Surgeon  Anesthesia Plan Comments:        Anesthesia Quick Evaluation

## 2020-05-07 NOTE — H&P (Signed)
.   HISTORY AND PHYSICAL INTERVAL NOTE:  05/07/2020  12:03 PM  Mark Wiggins  has presented today for surgery, with the diagnosis of NONUNION OF RIGHT FIRST MPJ FUSION,PAINFUL HARDWARE.  The various methods of treatment have been discussed with the patient.  No guarantees were given.  After consideration of risks, benefits and other options for treatment, the patient has consented to surgery.  I have reviewed the patients' chart and labs.    Patient Vitals for the past 24 hrs:  BP Temp Temp src Pulse Resp SpO2 Height Weight  05/07/20 1145 138/86 -- -- -- -- -- -- --  05/07/20 1125 (!) 151/97 98.1 F (36.7 C) Oral 92 (!) 22 97 % 5\' 8"  (1.727 m) 113.4 kg    A history and physical examination was performed in my office.  The patient was reexamined.  There have been no changes to this history and physical examination.  Tyson Babinski, DPM

## 2020-05-07 NOTE — Discharge Instructions (Signed)

## 2020-05-07 NOTE — Transfer of Care (Signed)
Immediate Anesthesia Transfer of Care Note  Patient: Mark Wiggins  Procedure(s) Performed: HARDWARE REMOVAL RIGHT FOOT (Right Toe) RIGHT FIRST MPJ FUSION (Right Toe)  Patient Location: PACU  Anesthesia Type:General  Level of Consciousness: awake, alert , oriented and patient cooperative  Airway & Oxygen Therapy: Patient Spontanous Breathing and Patient connected to nasal cannula oxygen  Post-op Assessment: Report given to RN, Post -op Vital signs reviewed and stable and Patient moving all extremities X 4  Post vital signs: Reviewed and stable  Last Vitals:  Vitals Value Taken Time  BP 127/82 05/07/20 1506  Temp    Pulse 104 05/07/20 1507  Resp 26 05/07/20 1507  SpO2 94 % 05/07/20 1507  Vitals shown include unvalidated device data.  Last Pain:  Vitals:   05/07/20 1125  TempSrc: Oral  PainSc: 0-No pain      Patients Stated Pain Goal: 6 (34/35/68 6168)  Complications: No complications documented.

## 2020-05-07 NOTE — Brief Op Note (Signed)
05/07/2020  3:04 PM  PATIENT:  Mark Wiggins  50 y.o. adult  PRE-OPERATIVE DIAGNOSIS:  NONUNION OF RIGHT FIRST MPJ FUSION,PAINFUL HARDWARE  POST-OPERATIVE DIAGNOSIS:  NONUNION OF RIGHT FIRST MPJ FUSION,PAINFUL HARDWARE  PROCEDURE:  1) Hardware removal right first MPJ.  2) Fusion of the first MPJ right foot.    SURGEON:  Surgeon(s) and Role:    * Posey Pronto, Estelle, DPM - Primary    ASSISTANTS:  Caprice Beaver, DPM   ANESTHESIA:   general and MAC  EBL:  None    BLOOD ADMINISTERED:none  DRAINS: none   LOCAL MEDICATIONS USED:  MARCAINE   , LIDOCAINE  and Amount: 10 ml post op  SPECIMEN:  Source of Specimen:  Capsule of the first MPJ.   DISPOSITION OF SPECIMEN:  PATHOLOGY  COUNTS:  YES  TOURNIQUET:   Total Tourniquet Time Documented: Calf (Right) - 130 minutes Total: Calf (Right) - 130 minutes   DICTATION: .Viviann Spare Dictation  PLAN OF CARE: Discharge to home after PACU  PATIENT DISPOSITION:  PACU - hemodynamically stable.   Delay start of Pharmacological VTE agent (>24hrs) due to surgical blood loss or risk of bleeding: not applicable

## 2020-05-07 NOTE — Anesthesia Procedure Notes (Signed)
Procedure Name: LMA Insertion Date/Time: 05/07/2020 12:37 PM Performed by: Karna Dupes, CRNA Pre-anesthesia Checklist: Patient identified, Emergency Drugs available, Suction available and Patient being monitored Patient Re-evaluated:Patient Re-evaluated prior to induction Oxygen Delivery Method: Circle system utilized Preoxygenation: Pre-oxygenation with 100% oxygen Induction Type: IV induction LMA: LMA inserted LMA Size: 4.0 Number of attempts: 2 Tube secured with: Tape Dental Injury: Teeth and Oropharynx as per pre-operative assessment

## 2020-05-07 NOTE — Anesthesia Postprocedure Evaluation (Signed)
Anesthesia Post Note  Patient: Mark Wiggins  Procedure(s) Performed: HARDWARE REMOVAL RIGHT FOOT (Right Toe) RIGHT FIRST MPJ FUSION (Right Toe)  Patient location during evaluation: Phase II Anesthesia Type: General Level of consciousness: awake and alert and oriented Pain management: pain level controlled Vital Signs Assessment: post-procedure vital signs reviewed and stable Respiratory status: spontaneous breathing and respiratory function stable Cardiovascular status: blood pressure returned to baseline and stable Postop Assessment: no apparent nausea or vomiting Anesthetic complications: no   No complications documented.   Last Vitals:  Vitals:   05/07/20 1545 05/07/20 1605  BP: 110/72 132/81  Pulse: 99 98  Resp: 14 16  Temp:  36.9 C  SpO2: 92% 95%    Last Pain:  Vitals:   05/07/20 1605  TempSrc: Oral  PainSc: 3                  Marico Buckle [address]

## 2020-05-07 NOTE — Op Note (Signed)
05/07/2020  3:04 PM  PATIENT:  Mark Wiggins  50 y.o. adult  PRE-OPERATIVE DIAGNOSIS:  NONUNION OF RIGHT FIRST MPJ FUSION,PAINFUL HARDWARE  POST-OPERATIVE DIAGNOSIS:  NONUNION OF RIGHT FIRST MPJ FUSION,PAINFUL HARDWARE  PROCEDURE:  1) Hardware removal right first MPJ.  2) Fusion of the first MPJ right foot.   SURGEON:  Surgeon(s) and Role:    * Tyson Babinski, DPM - Primary    ASSISTANTS:  Caprice Beaver, DPM   ANESTHESIA:   general and MAC  EBL:  None    BLOOD ADMINISTERED:none  Material used: 2.5cc of DBM, 2 x 3.0 [employer] screw. 2-0 Vicryl, 3-0 Vicryl, 4-0 Prolene.   LOCAL MEDICATIONS USED:  MARCAINE   , LIDOCAINE  and Amount: 10 ml post op  SPECIMEN:  Source of Specimen:  Capsule of the first MPJ.   DISPOSITION OF SPECIMEN:  PATHOLOGY  TOURNIQUET:   Total Tourniquet Time Documented: Calf (Right) - 130 minutes Total: Calf (Right) - 130 minutes  PLAN OF CARE: Discharge to home after PACU  PATIENT DISPOSITION:  PACU - hemodynamically stable.  Patient was brought into the operating room laid supine on the operating table. Ankle tourniquet was applied to the surgical extremity. General anesthesia was performed by anaesthesilogy team. The foot was the prepped, scrubbed and draped in aseptic manner. Using an esmarch band the tourniquet on the surgical site was inflatted at 23mHG.   Attention was directed to the dorsal medial aspect of theright first MPJ. A 7cm dorsal linear longitudinal incision was made medial and parallel to the extensor hallucis longus tendon. Dissection was continued deep down to the level of the first metatarsophalangeal joint.A linear longitudinal incision was made along the dorsal aspect of the first metatarsophalangeal joint capsule. It was noted that there was necrosis of the soft tissue noted around the metal plate and screw. The tissue was black and slimy in nature. The soft tissue was dissected out and send to pathology. The  screws were removed from the proximal phalanx. The screws from the first metatarsal head was removed. The plate was removed. It was noted that the joint still moveable. There was some osteophyte noted on the dorsal medial aspect of the joint. Using a bone rongeur the osteophytes were removed. Joint was reprepared using curette. The base of the proximal phalanx and first metatarsal head fibrocartilage was removed. Fish scaling method was also used to promote cancellous bone bleeding using mallet and osteotome at the first metatarsal head and base of proximal phalanx. There was not significant shortening or void noted. Decision was made not use the bone graft from calcaneus. At this time two crossing screws were chosen as method of fixation due to broke screws remaining in the proximal phalanx. A 3.0 334mx 2 crossing screws were inserted across the first MPJ. Fluoroscopy was used to see the position of the screws. The position of the hallux was rectus and in good alignment compare to second toe. There was adequate compression noted. Surgical site was irrigated. DBM was used to fill the void in the remaining joint of the MPJ. The capsule was closed using 2-0 Vicryl. Sub cutaneous tissue closed using 3-0 Vicryl. Skin was closed using 4-0 prolene.   DSD was applied. Tourniquet was deflated. Capillary refill time was brisk to lesser toes. Patient will be nonweightbearing with cam walker and knee scooter.

## 2020-05-09 LAB — SURGICAL PATHOLOGY

## 2020-05-12 ENCOUNTER — Encounter (HOSPITAL_COMMUNITY): Payer: Self-pay | Admitting: Podiatry

## 2020-10-06 IMAGING — CR DG FOOT COMPLETE 3+V*R*
1 series · 3 of 3 positions shown · non-contrast
Comparison: 10/02/2018

CLINICAL DATA: Postoperative

EXAM:
RIGHT FOOT COMPLETE - 3+ VIEW

[Series 1: ap · 0.17mm/px · 3 of 3 slices shown]
[im 1/3]
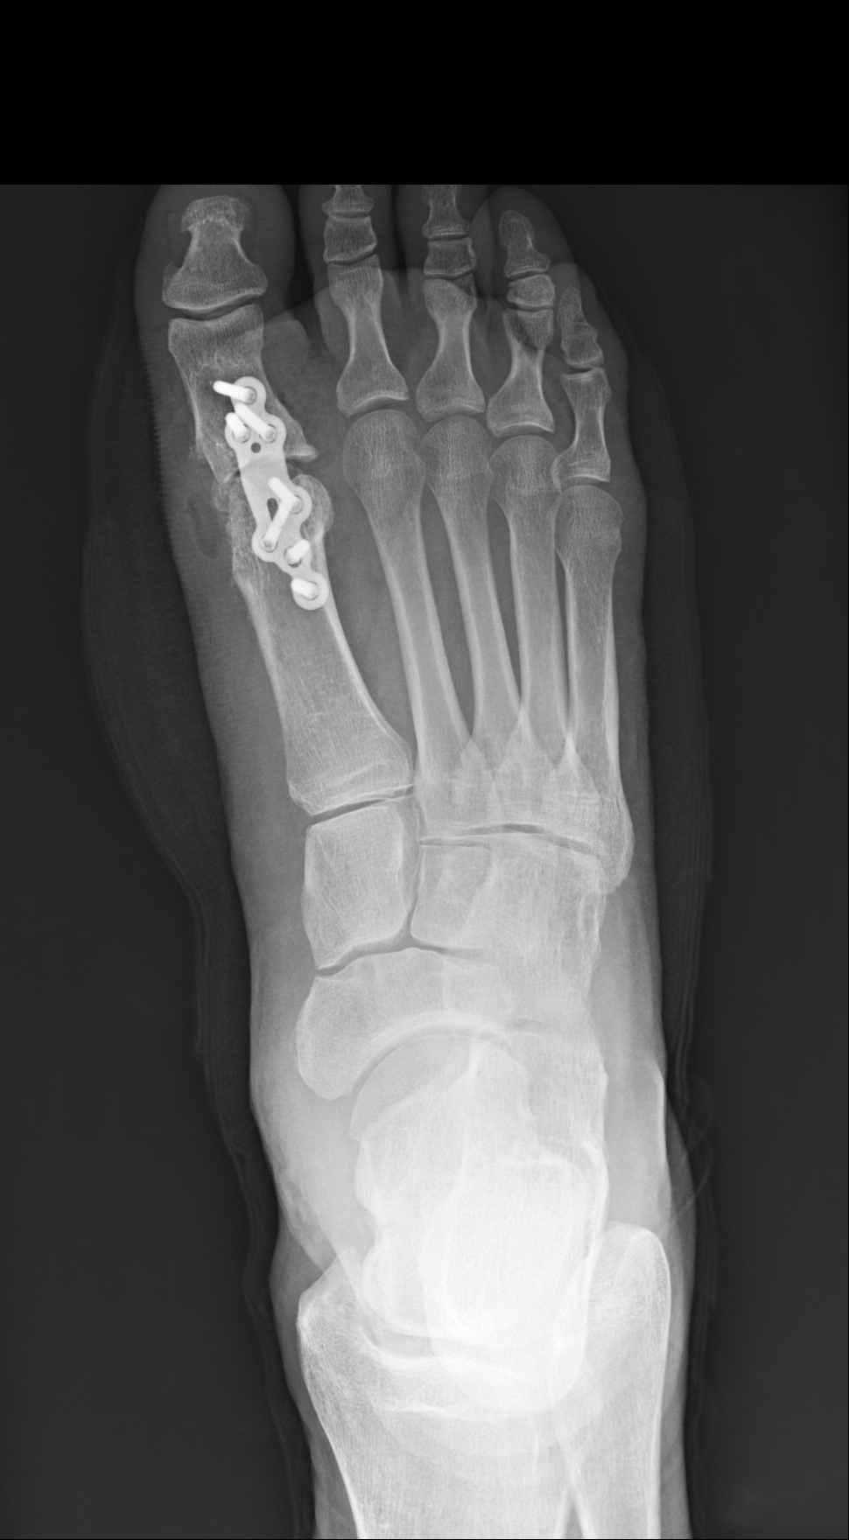
[im 2/3]
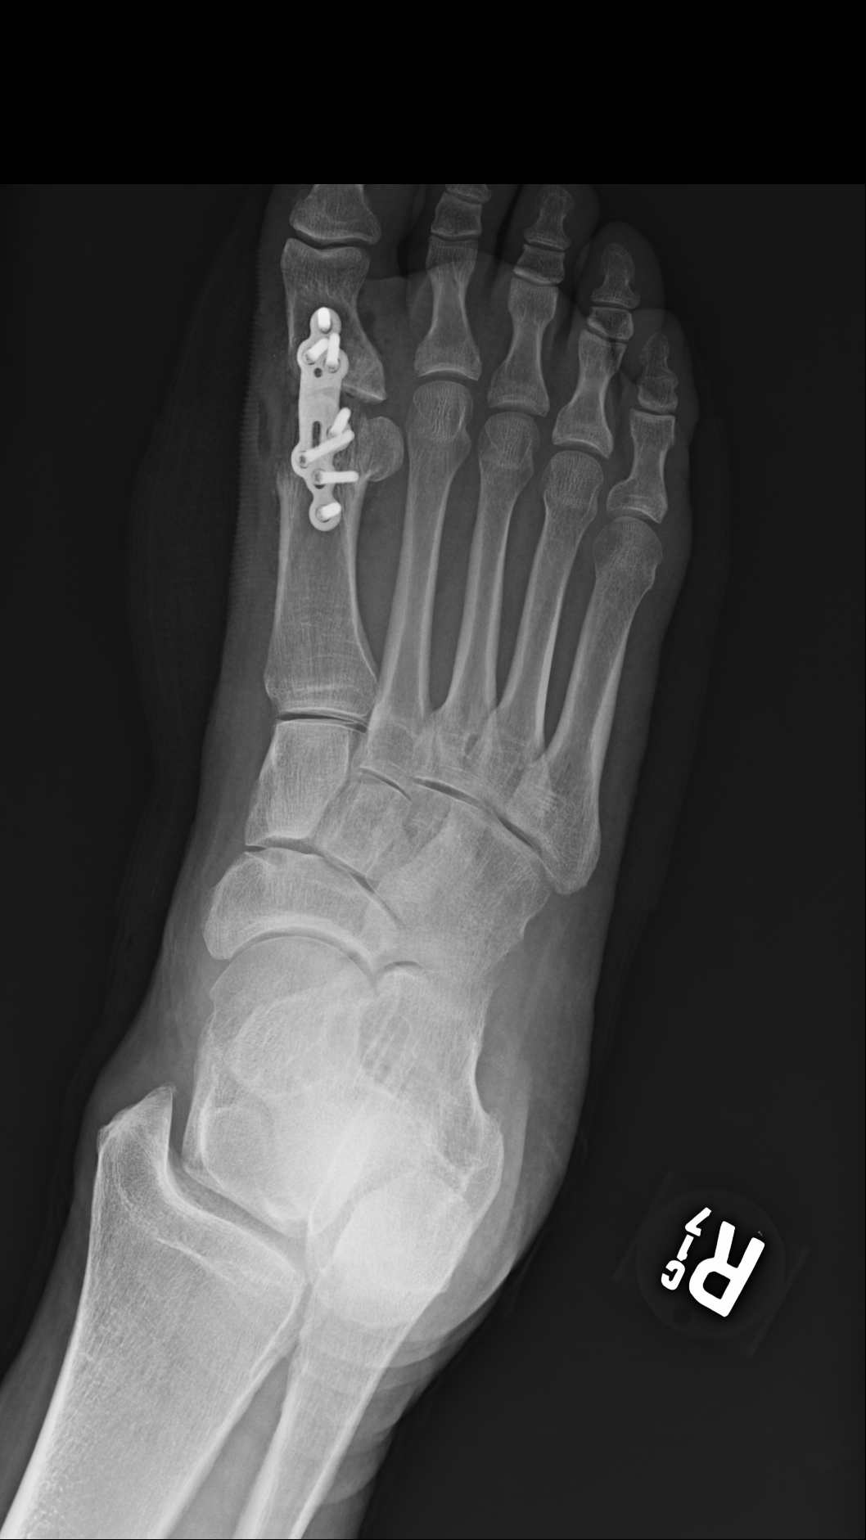
[im 3/3]
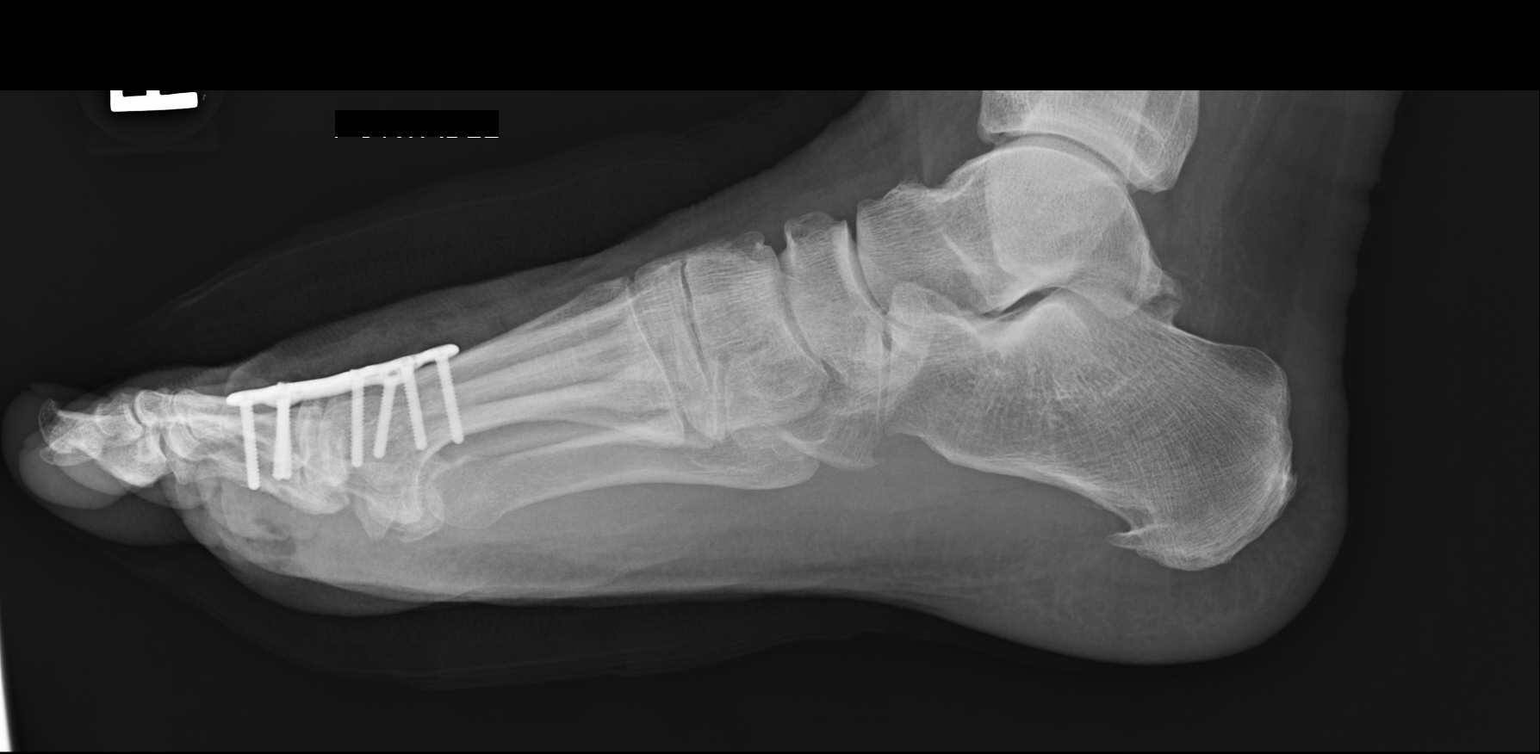

[3 of 3 positions shown; findings below may reference images not displayed]

FINDINGS: Interval postoperative findings of plate and screw fusion of the
right first metatarsophalangeal joint with expected overlying
postoperative changes. No evidence of perihardware fracture. The
remaining joint spaces are preserved.
IMPRESSION: Interval postoperative findings of plate and screw fusion of the
right first metatarsophalangeal joint with expected overlying
postoperative changes. No evidence of perihardware fracture.

## 2020-10-15 ENCOUNTER — Other Ambulatory Visit: Payer: Self-pay | Admitting: Podiatry

## 2020-10-27 ENCOUNTER — Encounter (HOSPITAL_COMMUNITY): Payer: BC Managed Care – PPO

## 2020-10-29 ENCOUNTER — Ambulatory Visit: Admit: 2020-10-29 | Payer: BC Managed Care – PPO

## 2020-10-29 SURGERY — REMOVAL, HARDWARE
Anesthesia: General | Site: Foot | Laterality: Right

## 2020-12-18 NOTE — Patient Instructions (Signed)
Mark Wiggins  12/18/2020     @PREFPERIOPPHARMACY @   Your procedure is scheduled on 12/24/2020.   Report to [facility] at  0700 A.M.   Call this number if you have problems the morning of surgery:  [phone]   Remember:  Do not eat or drink after midnight.      Use your inhaler before you come and bring your rescue inhaler with you.    DO NOT take any medications for diabetes the morning of your procedure.    Take these medicines the morning of surgery with A SIP OF WATER        amlodipine, zyrtec, lexapro, omeprazole.     Do not wear jewelry, make-up or nail polish.  Do not wear lotions, powders, or perfumes, or deodorant.  Do not shave 48 hours prior to surgery.  Men may shave face and neck.  Do not bring valuables to the hospital.  [facility] is not responsible for any belongings or valuables.  Contacts, dentures or bridgework may not be worn into surgery.  Leave your suitcase in the car.  After surgery it may be brought to your room.  For patients admitted to the hospital, discharge time will be determined by your treatment team.  Patients discharged the day of surgery will not be allowed to drive home and must have someone with them for 24 hours.    Special instructions:   DO NOT smoke tobacco or vape for 24 hours before your procedure.  Please read over the following fact sheets that you were given. Coughing and Deep Breathing, Surgical Site Infection Prevention, Anesthesia Post-op Instructions, and Care and Recovery After Surgery      Metatarsal Osteotomy, Care After This sheet gives you information about how to care for yourself after your procedure. Your health care provider may also give you more specific instructions. If you have problems or questions, contact your health care provider. What can I expect after the procedure? After the procedure, it is common to have: Soreness. Pain. Stiffness. Swelling. Follow these  instructions at home: Medicines Take over-the-counter and prescription medicines only as told by your health care provider. Ask your health care provider if the medicine prescribed to you can cause constipation. You may need to take these actions to prevent or treat constipation: Drink enough fluids to keep your urine pale yellow. Take over-the-counter or prescription medicines. Eat foods that are high in fiber, such as beans, whole grains, and fresh fruits and vegetables. Limit foods that are high in fat and processed sugars, such as fried or sweet foods. If you have a splint or walking boot: Wear it as told by your health care provider. Remove it only as told by your health care provider. Loosen it if your toes tingle, become numb, or turn cold and blue. Keep it clean. If it is not waterproof: Do not let it get wet. Cover it with a watertight covering when you take a bath or shower. Bathing Do not take baths, swim, or use a hot tub until your health care provider approves. Ask your health care provider if you may take showers. You may only be allowed to take sponge baths. Keep the bandage (dressing) dry. Incision care  Follow instructions from your health care provider about how to take care of your incision. Make sure you: Wash your hands with soap and water for at least 20 seconds before and after you change your bandage.  If soap and water are not available, use hand sanitizer. Leave stitches (sutures), skin glue, or adhesive strips in place. These skin closures may need to stay in place for 2 weeks or longer. If adhesive strip edges start to loosen and curl up, you may trim the loose edges. Do not remove adhesive strips completely unless your health care provider tells you to do that. Check your incision area every day for signs of infection. Check for: Redness, swelling, or pain. Fluid or blood. Warmth. Pus or a bad smell. Managing pain, stiffness, and swelling  If directed, put  ice on the injured area. To do this: If you have a removable splint, remove it as told by your health care provider. Put ice in a plastic bag. Place a towel between your skin and the bag. Leave the ice on for 20 minutes, 2-3 times a day. Remove the ice if your skin turns bright red. This is very important. If you cannot feel pain, heat, or cold, you have a greater risk of damage to the area. Move your toes often to reduce stiffness and swelling. Raise (elevate) the injured area above the level of your heart while you are sitting or lying down. Driving Ask your health care provider if the medicine prescribed to you requires you to avoid driving or using machinery. If you were given a sedative during the procedure, it can affect you for several hours. Do not drive or operate machinery until your health care provider says that it is safe. Ask your health care provider when it is safe to drive if you have a dressing, splint, special shoe, or walking boot on your foot. General instructions Do not remove the dressing around your foot until directed by your health care provider. If you were given a splint, special shoe, or walking boot, wear it as told by your health care provider. Do not use the injured limb to support your body weight until your health care provider says that you can. Use crutches or a walker as told by your health care provider. Return to your normal activities as told by your health care provider. Ask your health care provider what activities are safe for you. Do not use any products that contain nicotine or tobacco, such as cigarettes, e-cigarettes, and chewing tobacco. These can delay bone healing. If you need help quitting, ask your health care provider. Keep all follow-up visits. This is important. Contact a health care provider if: You have a fever. Your dressing becomes wet, loose, or stained with blood or discharge. You have pus or a bad smell coming from your incision or  bandage. You have pain or stiffness that does not get better or gets worse. You have tingling or numbness in your foot that does not get better or gets worse. Get help right away if: Your foot becomes red, swollen, or tender, or you notice redness traveling upward from your incision. You develop warm and tender swelling in your leg. You have chest pain. You have trouble breathing. These symptoms may represent a serious problem that is an emergency. Do not wait to see if the symptoms will go away. Get medical help right away. Call your local emergency services (911 in the [place] Do not drive yourself to the hospital. Summary After the procedure, it is common to have soreness, pain, stiffness, and swelling. Follow instructions on caring for your incision. Do not use the injured limb to support your body weight until your health care provider says that  you can. Contact your health care provider if you have a fever, have pus or a bad smell coming from your wound or dressing, or pain and stiffness that does not get better. Get help right away if you develop a warm and tender swelling in your leg, have chest pain, or trouble breathing. This information is not intended to replace advice given to you by your health care provider. Make sure you discuss any questions you have with your health care provider. Document Revised: 04/05/2019 Document Reviewed: 04/05/2019 Elsevier Patient Education  [place]. Orthopedic Hardware Removal, Care After This sheet gives you information about how to care for yourself after your procedure. Your health care provider may also give you more specific instructions. If you have problems or questions, contact your health care provider. What can I expect after the procedure? After the procedure, it is common to have: Soreness or pain. Some swelling in the area where the hardware was removed. A small amount of blood or clear fluid coming from your incision. Follow  these instructions at home: If you have a cast: Do not stick anything inside the cast to scratch your skin. Doing that increases your risk of infection. Check the skin around the cast every day. Tell your health care provider about any concerns. You may put lotion on dry skin around the edges of the cast. Do not put lotion on the skin underneath the cast. Keep the cast clean and dry. If you have a splint or boot: Wear the splint or boot as told by your health care provider. Remove it only as told by your health care provider. Loosen the splint or boot if your fingers or toes tingle, become numb, or turn cold and blue. Keep the splint or boot clean and dry. Bathing Do not take baths, swim, or use a hot tub until your health care provider approves. Ask your health care provider if you may take showers. You may only be allowed to take sponge baths. Keep the bandage (dressing) dry until your health care provider says it can be removed. If your cast, splint, or boot is not waterproof: Do not let it get wet. Cover it with a watertight covering when you take a bath or a shower. Incision care  Follow instructions from your health care provider about how to take care of your incision. Make sure you: Wash your hands with soap and water before you change your dressing. If soap and water are not available, use hand sanitizer. Change your dressing as told by your health care provider. Leave stitches (sutures), skin glue, or adhesive strips in place. These skin closures may need to stay in place for 2 weeks or longer. If adhesive strip edges start to loosen and curl up, you may trim the loose edges. Do not remove adhesive strips completely unless your health care provider tells you to do that. Check your incision area every day for signs of infection. Check for: Redness. More swelling or pain. More fluid or blood. Warmth. Pus or a bad smell. Managing pain, stiffness, and swelling  If directed, put  ice on the affected area: If you have a removable splint or boot, remove it as told by your health care provider. Put ice in a plastic bag. Place a towel between your skin and the bag. Leave the ice on for 20 minutes, 2-3 times a day. Move your fingers or toes often to avoid stiffness and to lessen swelling. Raise (elevate) the injured area above the  level of your heart while you are sitting or lying down. Driving Do not drive or use heavy machinery while taking prescription pain medicine. Do not drive for 24 hours if you were given a medicine to help you relax (sedative) during your procedure. Ask your health care provider when it is safe to drive if you have a cast, splint, or boot on the affected limb. Activity Ask your health care provider what activities are safe for you during recovery, and ask what activities you need to avoid. Do not use the injured limb to support your body weight until your health care provider says that you can. Do not play contact sports until your health care provider approves. Do exercises as told by your health care provider. Avoid sitting for a long time without moving. Get up and move around at least every few hours. This will help prevent blood clots. General instructions Do not put pressure on any part of the cast or splint until it is fully hardened. This may take several hours. If you are taking prescription pain medicine, take actions to prevent or treat constipation. Your health care provider may recommend that you: Drink enough fluid to keep your urine pale yellow. Eat foods that are high in fiber, such as fresh fruits and vegetables, whole grains, and beans. Limit foods that are high in fat and processed sugars, such as fried or sweet foods. Take an over-the-counter or prescription medicine for constipation. Do not use any products that contain nicotine or tobacco, such as cigarettes and e-cigarettes. These can delay bone healing after surgery. If you  need help quitting, ask your health care provider. Take over-the-counter and prescription medicines only as told by your health care provider. Keep all follow-up visits as told by your health care provider. This is important. Contact a health care provider if: You have lasting pain. You have redness around your incision. You have more swelling or pain around your incision. You have more fluid or blood coming from your incision. Your incision feels warm to the touch. You have pus or a bad smell coming from your incision. You are unable to do exercises or physical activity as told by your health care provider. Get help right away if: You have difficulty breathing. You have chest pain. You have severe pain. You have a fever or chills. You have numbness for more than 24 hours in the area where the hardware was removed. Summary After the procedure, it is common to have some pain and swelling in the area where the hardware was removed. Follow instructions from your health care provider about how to take care of your incision. Return to your normal activities as told by your health care provider. Ask your health care provider what activities are safe for you. This information is not intended to replace advice given to you by your health care provider. Make sure you discuss any questions you have with your health care provider. Document Revised: 03/14/2020 Document Reviewed: 03/14/2020 Elsevier Patient Education  [place] After This sheet gives you information about how to care for yourself after your procedure. Your health care provider may also give you more specific instructions. If you have problems or questions, contact your health care provider. What can I expect after the procedure? After the procedure, it is common to have: Tiredness. Forgetfulness about what happened after the procedure. Impaired judgment for important decisions. Nausea or  vomiting. Some difficulty with balance. Follow these instructions at home:  For the time period you were told by your health care provider:   Rest as needed. Do not participate in activities where you could fall or become injured. Do not drive or use machinery. Do not drink alcohol. Do not take sleeping pills or medicines that cause drowsiness. Do not make important decisions or sign legal documents. Do not take care of children on your own. Eating and drinking Follow the diet that is recommended by your health care provider. Drink enough fluid to keep your urine pale yellow. If you vomit: Drink water, juice, or soup when you can drink without vomiting. Make sure you have little or no nausea before eating solid foods. General instructions Have a responsible adult stay with you for the time you are told. It is important to have someone help care for you until you are awake and alert. Take over-the-counter and prescription medicines only as told by your health care provider. If you have sleep apnea, surgery and certain medicines can increase your risk for breathing problems. Follow instructions from your health care provider about wearing your sleep device: Anytime you are sleeping, including during daytime naps. While taking prescription pain medicines, sleeping medicines, or medicines that make you drowsy. Avoid smoking. Keep all follow-up visits as told by your health care provider. This is important. Contact a health care provider if: You keep feeling nauseous or you keep vomiting. You feel light-headed. You are still sleepy or having trouble with balance after 24 hours. You develop a rash. You have a fever. You have redness or swelling around the IV site. Get help right away if: You have trouble breathing. You have new-onset confusion at home. Summary For several hours after your procedure, you may feel tired. You may also be forgetful and have poor judgment. Have a  responsible adult stay with you for the time you are told. It is important to have someone help care for you until you are awake and alert. Rest as told. Do not drive or operate machinery. Do not drink alcohol or take sleeping pills. Get help right away if you have trouble breathing, or if you suddenly become confused. This information is not intended to replace advice given to you by your health care provider. Make sure you discuss any questions you have with your health care provider. Document Revised: 09/13/2019 Document Reviewed: 11/30/2018 Elsevier Patient Education  2022 [place]. How to Use Chlorhexidine for Bathing Chlorhexidine gluconate (CHG) is a germ-killing (antiseptic) solution that is used to clean the skin. It can get rid of the bacteria that normally live on the skin and can keep them away for about 24 hours. To clean your skin with CHG, you may be given: A CHG solution to use in the shower or as part of a sponge bath. A prepackaged cloth that contains CHG. Cleaning your skin with CHG may help lower the risk for infection: While you are staying in the intensive care unit of the hospital. If you have a vascular access, such as a central line, to provide short-term or long-term access to your veins. If you have a catheter to drain urine from your bladder. If you are on a ventilator. A ventilator is a machine that helps you breathe by moving air in and out of your lungs. After surgery. What are the risks? Risks of using CHG include: A skin reaction. Hearing loss, if CHG gets in your ears and you have a perforated eardrum. Eye injury, if CHG gets in your eyes and  is not rinsed out. The CHG product catching fire. Make sure that you avoid smoking and flames after applying CHG to your skin. Do not use CHG: If you have a chlorhexidine allergy or have previously reacted to chlorhexidine. On babies younger than 66 months of age. How to use CHG solution Use CHG only as told by  your health care provider, and follow the instructions on the label. Use the full amount of CHG as directed. Usually, this is one bottle. During a shower Follow these steps when using CHG solution during a shower (unless your health care provider gives you different instructions): Start the shower. Use your normal soap and shampoo to wash your face and hair. Turn off the shower or move out of the shower stream. Pour the CHG onto a clean washcloth. Do not use any type of brush or rough-edged sponge. Starting at your neck, lather your body down to your toes. Make sure you follow these instructions: If you will be having surgery, pay special attention to the part of your body where you will be having surgery. Scrub this area for at least 1 minute. Do not use CHG on your head or face. If the solution gets into your ears or eyes, rinse them well with water. Avoid your genital area. Avoid any areas of skin that have broken skin, cuts, or scrapes. Scrub your back and under your arms. Make sure to wash skin folds. Let the lather sit on your skin for 1-2 minutes or as long as told by your health care provider. Thoroughly rinse your entire body in the shower. Make sure that all body creases and crevices are rinsed well. Dry off with a clean towel. Do not put any substances on your body afterward--such as powder, lotion, or perfume--unless you are told to do so by your health care provider. Only use lotions that are recommended by the manufacturer. Put on clean clothes or pajamas. If it is the night before your surgery, sleep in clean sheets.  During a sponge bath Follow these steps when using CHG solution during a sponge bath (unless your health care provider gives you different instructions): Use your normal soap and shampoo to wash your face and hair. Pour the CHG onto a clean washcloth. Starting at your neck, lather your body down to your toes. Make sure you follow these instructions: If you will  be having surgery, pay special attention to the part of your body where you will be having surgery. Scrub this area for at least 1 minute. Do not use CHG on your head or face. If the solution gets into your ears or eyes, rinse them well with water. Avoid your genital area. Avoid any areas of skin that have broken skin, cuts, or scrapes. Scrub your back and under your arms. Make sure to wash skin folds. Let the lather sit on your skin for 1-2 minutes or as long as told by your health care provider. Using a different clean, wet washcloth, thoroughly rinse your entire body. Make sure that all body creases and crevices are rinsed well. Dry off with a clean towel. Do not put any substances on your body afterward--such as powder, lotion, or perfume--unless you are told to do so by your health care provider. Only use lotions that are recommended by the manufacturer. Put on clean clothes or pajamas. If it is the night before your surgery, sleep in clean sheets. How to use CHG prepackaged cloths Only use CHG cloths as told by your  health care provider, and follow the instructions on the label. Use the CHG cloth on clean, dry skin. Do not use the CHG cloth on your head or face unless your health care provider tells you to. When washing with the CHG cloth: Avoid your genital area. Avoid any areas of skin that have broken skin, cuts, or scrapes. Before surgery Follow these steps when using a CHG cloth to clean before surgery (unless your health care provider gives you different instructions): Using the CHG cloth, vigorously scrub the part of your body where you will be having surgery. Scrub using a back-and-forth motion for 3 minutes. The area on your body should be completely wet with CHG when you are done scrubbing. Do not rinse. Discard the cloth and let the area air-dry. Do not put any substances on the area afterward, such as powder, lotion, or perfume. Put on clean clothes or pajamas. If it is the  night before your surgery, sleep in clean sheets.  For general bathing Follow these steps when using CHG cloths for general bathing (unless your health care provider gives you different instructions). Use a separate CHG cloth for each area of your body. Make sure you wash between any folds of skin and between your fingers and toes. Wash your body in the following order, switching to a new cloth after each step: The front of your neck, shoulders, and chest. Both of your arms, under your arms, and your hands. Your stomach and groin area, avoiding the genitals. Your right leg and foot. Your left leg and foot. The back of your neck, your back, and your buttocks. Do not rinse. Discard the cloth and let the area air-dry. Do not put any substances on your body afterward--such as powder, lotion, or perfume--unless you are told to do so by your health care provider. Only use lotions that are recommended by the manufacturer. Put on clean clothes or pajamas. Contact a health care provider if: Your skin gets irritated after scrubbing. You have questions about using your solution or cloth. You swallow any chlorhexidine. Call your local poison control center (1-(712)211-8253 in the [place] Get help right away if: Your eyes itch badly, or they become very red or swollen. Your skin itches badly and is red or swollen. Your hearing changes. You have trouble seeing. You have swelling or tingling in your mouth or throat. You have trouble breathing. These symptoms may represent a serious problem that is an emergency. Do not wait to see if the symptoms will go away. Get medical help right away. Call your local emergency services (911 in the [place] Do not drive yourself to the hospital. Summary Chlorhexidine gluconate (CHG) is a germ-killing (antiseptic) solution that is used to clean the skin. Cleaning your skin with CHG may help to lower your risk for infection. You may be given CHG to use for bathing. It may be  in a bottle or in a prepackaged cloth to use on your skin. Carefully follow your health care provider's instructions and the instructions on the product label. Do not use CHG if you have a chlorhexidine allergy. Contact your health care provider if your skin gets irritated after scrubbing. This information is not intended to replace advice given to you by your health care provider. Make sure you discuss any questions you have with your health care provider. Document Revised: 03/10/2020 Document Reviewed: 03/10/2020 Elsevier Patient Education  2022 [employer].

## 2020-12-22 ENCOUNTER — Ambulatory Visit (HOSPITAL_COMMUNITY)
Admission: RE | Admit: 2020-12-22 | Discharge: 2020-12-22 | Disposition: A | Discharge status: Home / Self Care | Payer: BC Managed Care – PPO | Source: Ambulatory Visit | Attending: Podiatry | Admitting: Podiatry

## 2020-12-22 ENCOUNTER — Encounter (HOSPITAL_COMMUNITY): Payer: Self-pay

## 2020-12-22 ENCOUNTER — Encounter (HOSPITAL_COMMUNITY)
Admission: RE | Admit: 2020-12-22 | Discharge: 2020-12-22 | Disposition: A | Discharge status: Home / Self Care | Payer: BC Managed Care – PPO | Source: Ambulatory Visit | Attending: Podiatry | Admitting: Podiatry

## 2020-12-22 ENCOUNTER — Other Ambulatory Visit: Payer: Self-pay

## 2020-12-22 ENCOUNTER — Other Ambulatory Visit (HOSPITAL_COMMUNITY): Payer: Self-pay | Admitting: Podiatry

## 2020-12-22 VITALS — BP 115/83 | HR 99 | Temp 97.9°F | Resp 18 | Ht 68.0 in | Wt 237.0 lb

## 2020-12-22 DIAGNOSIS — M960 Pseudarthrosis after fusion or arthrodesis: Secondary | ICD-10-CM | POA: Diagnosis not present

## 2020-12-22 DIAGNOSIS — I10 Essential (primary) hypertension: Secondary | ICD-10-CM | POA: Diagnosis not present

## 2020-12-22 DIAGNOSIS — T8484XA Pain due to internal orthopedic prosthetic devices, implants and grafts, initial encounter: Secondary | ICD-10-CM | POA: Diagnosis present

## 2020-12-22 DIAGNOSIS — E119 Type 2 diabetes mellitus without complications: Secondary | ICD-10-CM | POA: Diagnosis not present

## 2020-12-22 DIAGNOSIS — Z85118 Personal history of other malignant neoplasm of bronchus and lung: Secondary | ICD-10-CM | POA: Diagnosis not present

## 2020-12-22 DIAGNOSIS — Z7984 Long term (current) use of oral hypoglycemic drugs: Secondary | ICD-10-CM | POA: Diagnosis not present

## 2020-12-22 DIAGNOSIS — K219 Gastro-esophageal reflux disease without esophagitis: Secondary | ICD-10-CM | POA: Diagnosis not present

## 2020-12-22 DIAGNOSIS — Z01812 Encounter for preprocedural laboratory examination: Secondary | ICD-10-CM | POA: Insufficient documentation

## 2020-12-22 DIAGNOSIS — E1169 Type 2 diabetes mellitus with other specified complication: Secondary | ICD-10-CM

## 2020-12-22 DIAGNOSIS — Z981 Arthrodesis status: Secondary | ICD-10-CM | POA: Diagnosis not present

## 2020-12-22 DIAGNOSIS — G709 Myoneural disorder, unspecified: Secondary | ICD-10-CM | POA: Diagnosis not present

## 2020-12-22 DIAGNOSIS — F419 Anxiety disorder, unspecified: Secondary | ICD-10-CM | POA: Diagnosis not present

## 2020-12-22 DIAGNOSIS — J45909 Unspecified asthma, uncomplicated: Secondary | ICD-10-CM | POA: Diagnosis not present

## 2020-12-22 DIAGNOSIS — G4730 Sleep apnea, unspecified: Secondary | ICD-10-CM | POA: Diagnosis not present

## 2020-12-22 DIAGNOSIS — Z96649 Presence of unspecified artificial hip joint: Secondary | ICD-10-CM | POA: Insufficient documentation

## 2020-12-22 LAB — BASIC METABOLIC PANEL
Anion gap: 10 (ref 5–15)
BUN: 12 mg/dL (ref 6–20)
CO2: 24 mmol/L (ref 22–32)
Calcium: 9.5 mg/dL (ref 8.9–10.3)
Chloride: 103 mmol/L (ref 98–111)
Creatinine, Ser: 0.81 mg/dL (ref 0.61–1.24)
GFR, Estimated: >60 mL/min (ref >60)
Glucose, Bld: 99 mg/dL (ref 70–99)
Potassium: 4.1 mmol/L (ref 3.5–5.1)
Sodium: 137 mmol/L (ref 135–145)

## 2020-12-22 LAB — HEMOGLOBIN A1C
Hgb A1c MFr Bld: 6.8 % — ABNORMAL HIGH (ref 4.8–5.6)
Mean Plasma Glucose: 148.46 mg/dL

## 2020-12-24 ENCOUNTER — Ambulatory Visit (HOSPITAL_COMMUNITY): Payer: BC Managed Care – PPO

## 2020-12-24 ENCOUNTER — Ambulatory Visit (HOSPITAL_COMMUNITY)
Admission: RE | Admit: 2020-12-24 | Discharge: 2020-12-24 | Disposition: A | Discharge status: Home / Self Care | Payer: BC Managed Care – PPO | Source: Ambulatory Visit | Attending: Podiatry | Admitting: Podiatry

## 2020-12-24 ENCOUNTER — Encounter (HOSPITAL_COMMUNITY): Payer: Self-pay

## 2020-12-24 ENCOUNTER — Other Ambulatory Visit: Payer: Self-pay

## 2020-12-24 ENCOUNTER — Ambulatory Visit (HOSPITAL_COMMUNITY): Payer: BC Managed Care – PPO | Admitting: Anesthesiology

## 2020-12-24 ENCOUNTER — Encounter (HOSPITAL_COMMUNITY)
Admission: RE | Disposition: A | Discharge status: Home / Self Care | Payer: Self-pay | Source: Ambulatory Visit | Attending: Podiatry

## 2020-12-24 DIAGNOSIS — M960 Pseudarthrosis after fusion or arthrodesis: Secondary | ICD-10-CM | POA: Insufficient documentation

## 2020-12-24 DIAGNOSIS — J45909 Unspecified asthma, uncomplicated: Secondary | ICD-10-CM | POA: Insufficient documentation

## 2020-12-24 DIAGNOSIS — E119 Type 2 diabetes mellitus without complications: Secondary | ICD-10-CM | POA: Insufficient documentation

## 2020-12-24 DIAGNOSIS — G4730 Sleep apnea, unspecified: Secondary | ICD-10-CM | POA: Insufficient documentation

## 2020-12-24 DIAGNOSIS — Z419 Encounter for procedure for purposes other than remedying health state, unspecified: Secondary | ICD-10-CM

## 2020-12-24 DIAGNOSIS — Z981 Arthrodesis status: Secondary | ICD-10-CM | POA: Insufficient documentation

## 2020-12-24 DIAGNOSIS — F419 Anxiety disorder, unspecified: Secondary | ICD-10-CM | POA: Insufficient documentation

## 2020-12-24 DIAGNOSIS — Z9889 Other specified postprocedural states: Secondary | ICD-10-CM

## 2020-12-24 DIAGNOSIS — I10 Essential (primary) hypertension: Secondary | ICD-10-CM | POA: Insufficient documentation

## 2020-12-24 DIAGNOSIS — K219 Gastro-esophageal reflux disease without esophagitis: Secondary | ICD-10-CM | POA: Insufficient documentation

## 2020-12-24 DIAGNOSIS — G709 Myoneural disorder, unspecified: Secondary | ICD-10-CM | POA: Insufficient documentation

## 2020-12-24 DIAGNOSIS — Z7984 Long term (current) use of oral hypoglycemic drugs: Secondary | ICD-10-CM | POA: Insufficient documentation

## 2020-12-24 DIAGNOSIS — Z85118 Personal history of other malignant neoplasm of bronchus and lung: Secondary | ICD-10-CM | POA: Insufficient documentation

## 2020-12-24 HISTORY — PX: HARDWARE REMOVAL: SHX979

## 2020-12-24 HISTORY — PX: FOOT ARTHRODESIS: SHX1655

## 2020-12-24 LAB — GLUCOSE, CAPILLARY
Glucose-Capillary: 159 mg/dL — ABNORMAL HIGH (ref 70–99)
Glucose-Capillary: 140 mg/dL — ABNORMAL HIGH (ref 70–99)

## 2020-12-24 SURGERY — REMOVAL, HARDWARE
Anesthesia: General | Site: Foot | Laterality: Right

## 2020-12-24 MED ORDER — SCOPOLAMINE 1 MG/3DAYS TD PT72
MEDICATED_PATCH | TRANSDERMAL | Status: AC
  Filled 2020-12-24: qty 1

## 2020-12-24 MED ORDER — DEXAMETHASONE SODIUM PHOSPHATE 10 MG/ML IJ SOLN
INTRAMUSCULAR | Status: AC
  Filled 2020-12-24: qty 1

## 2020-12-24 MED ORDER — PROPOFOL 10 MG/ML IV BOLUS
INTRAVENOUS | Status: AC
  Filled 2020-12-24: qty 20

## 2020-12-24 MED ORDER — SCOPOLAMINE 1 MG/3DAYS TD PT72
1.0000 | MEDICATED_PATCH | TRANSDERMAL | Status: DC
  Administered 2020-12-24: 08:00:00 1.5 mg via TRANSDERMAL

## 2020-12-24 MED ORDER — PHENYLEPHRINE 40 MCG/ML (10ML) SYRINGE FOR IV PUSH (FOR BLOOD PRESSURE SUPPORT)
PREFILLED_SYRINGE | INTRAVENOUS | Status: DC | PRN
  Administered 2020-12-24 (×5): 80 ug via INTRAVENOUS

## 2020-12-24 MED ORDER — LIDOCAINE 2% (20 MG/ML) 5 ML SYRINGE
INTRAMUSCULAR | Status: DC | PRN
  Administered 2020-12-24: 100 mg via INTRAVENOUS

## 2020-12-24 MED ORDER — BUPIVACAINE HCL (PF) 0.5 % IJ SOLN
INTRAMUSCULAR | Status: AC
  Filled 2020-12-24: qty 30

## 2020-12-24 MED ORDER — KETOROLAC TROMETHAMINE 30 MG/ML IJ SOLN
INTRAMUSCULAR | Status: DC | PRN
  Administered 2020-12-24: 30 mg via INTRAVENOUS

## 2020-12-24 MED ORDER — LACTATED RINGERS IV SOLN: INTRAVENOUS | Status: DC

## 2020-12-24 MED ORDER — CLINDAMYCIN PHOSPHATE 900 MG/50ML IV SOLN
900.0000 mg | Freq: Once | INTRAVENOUS | Status: AC
  Administered 2020-12-24: 08:00:00 600 mg via INTRAVENOUS

## 2020-12-24 MED ORDER — CHLORHEXIDINE GLUCONATE 0.12 % MT SOLN
OROMUCOSAL | Status: AC
  Administered 2020-12-24: 07:00:00 15 mL via OROMUCOSAL
  Filled 2020-12-24: qty 15

## 2020-12-24 MED ORDER — FENTANYL CITRATE (PF) 100 MCG/2ML IJ SOLN
INTRAMUSCULAR | Status: DC | PRN
  Administered 2020-12-24 (×2): 50 ug via INTRAVENOUS

## 2020-12-24 MED ORDER — KETOROLAC TROMETHAMINE 30 MG/ML IJ SOLN
INTRAMUSCULAR | Status: AC
  Filled 2020-12-24: qty 1

## 2020-12-24 MED ORDER — LIDOCAINE HCL (PF) 1 % IJ SOLN
INTRAMUSCULAR | Status: AC
  Filled 2020-12-24: qty 30

## 2020-12-24 MED ORDER — GLYCOPYRROLATE PF 0.2 MG/ML IJ SOSY
PREFILLED_SYRINGE | INTRAMUSCULAR | Status: DC | PRN
  Administered 2020-12-24: .1 mg via INTRAVENOUS

## 2020-12-24 MED ORDER — KETAMINE HCL 10 MG/ML IJ SOLN
INTRAMUSCULAR | Status: DC | PRN
  Administered 2020-12-24 (×2): 10 mg via INTRAVENOUS

## 2020-12-24 MED ORDER — DEXAMETHASONE SODIUM PHOSPHATE 4 MG/ML IJ SOLN
INTRAMUSCULAR | Status: DC | PRN
  Administered 2020-12-24: 4 mg via INTRAVENOUS

## 2020-12-24 MED ORDER — MEPERIDINE HCL 50 MG/ML IJ SOLN: 6.2500 mg | INTRAMUSCULAR | Status: DC | PRN

## 2020-12-24 MED ORDER — FENTANYL CITRATE (PF) 250 MCG/5ML IJ SOLN
INTRAMUSCULAR | Status: AC
  Filled 2020-12-24: qty 5

## 2020-12-24 MED ORDER — PHENYLEPHRINE 40 MCG/ML (10ML) SYRINGE FOR IV PUSH (FOR BLOOD PRESSURE SUPPORT)
PREFILLED_SYRINGE | INTRAVENOUS | Status: AC
  Filled 2020-12-24: qty 10

## 2020-12-24 MED ORDER — EPHEDRINE SULFATE-NACL 50-0.9 MG/10ML-% IV SOSY
PREFILLED_SYRINGE | INTRAVENOUS | Status: DC | PRN
  Administered 2020-12-24: 5 mg via INTRAVENOUS

## 2020-12-24 MED ORDER — ONDANSETRON HCL 4 MG/2ML IJ SOLN: 4.0000 mg | Freq: Once | INTRAMUSCULAR | Status: DC | PRN

## 2020-12-24 MED ORDER — PROPOFOL 500 MG/50ML IV EMUL
INTRAVENOUS | Status: DC | PRN
  Administered 2020-12-24: 25 ug/kg/min via INTRAVENOUS

## 2020-12-24 MED ORDER — MIDAZOLAM HCL 2 MG/2ML IJ SOLN
INTRAMUSCULAR | Status: AC
  Filled 2020-12-24: qty 2

## 2020-12-24 MED ORDER — OXYCODONE-ACETAMINOPHEN 5-325 MG PO TABS
ORAL_TABLET | ORAL | Status: AC
  Filled 2020-12-24: qty 1

## 2020-12-24 MED ORDER — MIDAZOLAM HCL 5 MG/5ML IJ SOLN
INTRAMUSCULAR | Status: DC | PRN
  Administered 2020-12-24: 2 mg via INTRAVENOUS

## 2020-12-24 MED ORDER — CHLORHEXIDINE GLUCONATE 0.12 % MT SOLN: 15.0000 mL | Freq: Once | OROMUCOSAL | Status: AC

## 2020-12-24 MED ORDER — GLYCOPYRROLATE PF 0.2 MG/ML IJ SOSY
PREFILLED_SYRINGE | INTRAMUSCULAR | Status: AC
  Filled 2020-12-24: qty 1

## 2020-12-24 MED ORDER — HYDROMORPHONE HCL 1 MG/ML IJ SOLN: 0.2500 mg | INTRAMUSCULAR | Status: DC | PRN

## 2020-12-24 MED ORDER — LIDOCAINE HCL 1 % IJ SOLN
INTRAMUSCULAR | Status: DC | PRN
  Administered 2020-12-24: 18 mL via INTRAMUSCULAR

## 2020-12-24 MED ORDER — PROPOFOL 10 MG/ML IV BOLUS
INTRAVENOUS | Status: DC | PRN
  Administered 2020-12-24: 40 mg via INTRAVENOUS
  Administered 2020-12-24: 200 mg via INTRAVENOUS

## 2020-12-24 MED ORDER — KETAMINE HCL 50 MG/5ML IJ SOSY
PREFILLED_SYRINGE | INTRAMUSCULAR | Status: AC
  Filled 2020-12-24: qty 5

## 2020-12-24 MED ORDER — ONDANSETRON HCL 4 MG/2ML IJ SOLN
INTRAMUSCULAR | Status: AC
  Filled 2020-12-24: qty 2

## 2020-12-24 MED ORDER — CLINDAMYCIN PHOSPHATE 600 MG/50ML IV SOLN
INTRAVENOUS | Status: AC
  Filled 2020-12-24: qty 50

## 2020-12-24 MED ORDER — 0.9 % SODIUM CHLORIDE (POUR BTL) OPTIME
TOPICAL | Status: DC | PRN
  Administered 2020-12-24: 09:00:00 1000 mL

## 2020-12-24 MED ORDER — ORAL CARE MOUTH RINSE: 15.0000 mL | Freq: Once | OROMUCOSAL | Status: AC

## 2020-12-24 MED ORDER — OXYCODONE-ACETAMINOPHEN 5-325 MG PO TABS
1.0000 | ORAL_TABLET | Freq: Once | ORAL | Status: AC
  Administered 2020-12-24: 11:00:00 1 via ORAL

## 2020-12-24 MED ORDER — LIDOCAINE HCL (PF) 2 % IJ SOLN
INTRAMUSCULAR | Status: AC
  Filled 2020-12-24: qty 5

## 2020-12-24 MED ORDER — ONDANSETRON HCL 4 MG/2ML IJ SOLN
INTRAMUSCULAR | Status: DC | PRN
  Administered 2020-12-24: 4 mg via INTRAVENOUS

## 2020-12-24 MED ORDER — PHENYLEPHRINE HCL-NACL 20-0.9 MG/250ML-% IV SOLN
INTRAVENOUS | Status: DC | PRN
  Administered 2020-12-24: 50 ug/min via INTRAVENOUS

## 2020-12-24 MED ORDER — EPHEDRINE 5 MG/ML INJ
INTRAVENOUS | Status: AC
  Filled 2020-12-24: qty 5

## 2020-12-24 SURGICAL SUPPLY — 78 items
APL PRP STRL LF DISP 70% ISPRP (MISCELLANEOUS) ×2
APL SKNCLS STERI-STRIP NONHPOA (GAUZE/BANDAGES/DRESSINGS) ×2
BANDAGE CONFORM 2  STR LF (GAUZE/BANDAGES/DRESSINGS) ×2 IMPLANT
BANDAGE ELASTIC 4 VELCRO NS (GAUZE/BANDAGES/DRESSINGS) ×2 IMPLANT
BANDAGE ELASTIC 6 VELCRO NS (GAUZE/BANDAGES/DRESSINGS) ×2 IMPLANT
BANDAGE ESMARK 4X12 BL STRL LF (DISPOSABLE) ×2 IMPLANT
BANDAGE GAUZE ELAST BULKY 4 IN (GAUZE/BANDAGES/DRESSINGS) ×2 IMPLANT
BENZOIN TINCTURE PRP APPL 2/3 (GAUZE/BANDAGES/DRESSINGS) ×3 IMPLANT
BLADE AVERAGE 25X9 (BLADE) ×3 IMPLANT
BLADE SURG 15 STRL LF DISP TIS (BLADE) ×4 IMPLANT
BLADE SURG 15 STRL SS (BLADE) ×6
BNDG CMPR 12X4 ELC STRL LF (DISPOSABLE) ×2
BNDG CMPR STD VLCR NS LF 5.8X4 (GAUZE/BANDAGES/DRESSINGS) ×4
BNDG CMPR STD VLCR NS LF 5.8X6 (GAUZE/BANDAGES/DRESSINGS) ×4
BNDG CONFORM 2 STRL LF (GAUZE/BANDAGES/DRESSINGS) ×3 IMPLANT
BNDG ELASTIC 4X5.8 VLCR NS LF (GAUZE/BANDAGES/DRESSINGS) ×6 IMPLANT
BNDG ELASTIC 6X5.8 VLCR NS LF (GAUZE/BANDAGES/DRESSINGS) ×6 IMPLANT
BNDG ESMARK 4X12 BLUE STRL LF (DISPOSABLE) ×3
BNDG GAUZE ELAST 4 BULKY (GAUZE/BANDAGES/DRESSINGS) ×3 IMPLANT
BONE STAPLE 18X18X18 (Staple) ×3 IMPLANT
BOOT STEPPER DURA LG (SOFTGOODS) ×2 IMPLANT
BUR FAST CUTTING (BURR) ×3
BUR SRGRND 54.5X2.4X8 (BURR) ×1 IMPLANT
BURR SRGRND 54.5X2.4X8 (BURR) ×2
CHLORAPREP W/TINT 26 (MISCELLANEOUS) ×3 IMPLANT
CLOSURE STERI STRIP 1/2 X4 (GAUZE/BANDAGES/DRESSINGS) ×2 IMPLANT
CLOTH BEACON ORANGE TIMEOUT ST (SAFETY) ×3 IMPLANT
COVER LIGHT HANDLE STERIS (MISCELLANEOUS) ×6 IMPLANT
CUFF TOURN SGL QUICK 18X4 (TOURNIQUET CUFF) ×3 IMPLANT
DRAPE HALF SHEET 40X57 (DRAPES) ×3 IMPLANT
DRAPE OEC MINIVIEW 54X84 (DRAPES) ×3 IMPLANT
DRSG ADAPTIC 3X8 NADH LF (GAUZE/BANDAGES/DRESSINGS) ×3 IMPLANT
ELECT REM PT RETURN 9FT ADLT (ELECTROSURGICAL) ×3
ELECTRODE REM PT RTRN 9FT ADLT (ELECTROSURGICAL) ×2 IMPLANT
GAUZE SPONGE 4X4 12PLY STRL (GAUZE/BANDAGES/DRESSINGS) ×3 IMPLANT
GLOVE EXAM NITRILE MD LF STRL (GLOVE) ×3 IMPLANT
GLOVE SURG ENC MOIS LTX SZ7.5 (GLOVE) ×3 IMPLANT
GLOVE SURG LTX SZ7 (GLOVE) ×8 IMPLANT
GLOVE SURG UNDER POLY LF SZ7 (GLOVE) ×6 IMPLANT
GLOVE SURG UNDER POLY LF SZ7.5 (GLOVE) ×6 IMPLANT
GOWN STRL REUS W/ TWL LRG LVL3 (GOWN DISPOSABLE) ×2 IMPLANT
GOWN STRL REUS W/ TWL XL LVL3 (GOWN DISPOSABLE) ×2 IMPLANT
GOWN STRL REUS W/TWL LRG LVL3 (GOWN DISPOSABLE) ×9 IMPLANT
GOWN STRL REUS W/TWL XL LVL3 (GOWN DISPOSABLE) ×3
HANDLE RATCHET STRL (INSTRUMENTS) ×2 IMPLANT
K-WIRE 6 (WIRE) ×3
K-WIRE SGL PT/SMTH 9X35 (WIRE) ×3
KIT INSTRUMENT DYNAFORCE PLATE (KITS) ×2 IMPLANT
KIT TURNOVER KIT A (KITS) ×3 IMPLANT
KWIRE 6 (WIRE) ×1 IMPLANT
KWIRE SGL PT/SMTH 9X35 (WIRE) ×2 IMPLANT
MANIFOLD NEPTUNE II (INSTRUMENTS) ×3 IMPLANT
NDL HYPO 25X1 1.5 SAFETY (NEEDLE) ×5 IMPLANT
NEEDLE HYPO 25X1 1.5 SAFETY (NEEDLE) ×6 IMPLANT
NS IRRIG 1000ML POUR BTL (IV SOLUTION) ×3 IMPLANT
PACK BASIC LIMB (CUSTOM PROCEDURE TRAY) ×3 IMPLANT
PAD ARMBOARD 7.5X6 YLW CONV (MISCELLANEOUS) ×3 IMPLANT
PLATE MTP STANDARD 18 (Plate) ×3 IMPLANT
PLATE MTP STD 18 (Plate) ×1 IMPLANT
PUTTY DBX 2.5CC (Putty) ×3 IMPLANT
PUTTY DBX 2.5CC DEPUY (Putty) ×1 IMPLANT
RASP LG TEAR CROSS CUT (RASP) ×2 IMPLANT
SCREW LOCK 10X3.5XPASTRL (Screw) ×1 IMPLANT
SCREW LOCK POLY 3.5X18 (Screw) ×4 IMPLANT
SCREW LOCKING 3.5X10 (Screw) ×3 IMPLANT
SCREW LOCKING POLYAXIAL 3.5X16 (Screw) ×2 IMPLANT
SET BASIN LINEN APH (SET/KITS/TRAYS/PACK) ×3 IMPLANT
STAPLE BONE 18X18X18 COMPR (Staple) ×1 IMPLANT
STAPLE PREP KIT 18 (KITS) ×2 IMPLANT
STRIP CLOSURE SKIN 1/2X4 (GAUZE/BANDAGES/DRESSINGS) ×4 IMPLANT
SUT PROLENE 4 0 PS 2 18 (SUTURE) ×4 IMPLANT
SUT PROLENE NAB BLUE 3-0 30IN (SUTURE) ×2 IMPLANT
SUT VIC AB 2-0 CT2 27 (SUTURE) ×3 IMPLANT
SUT VIC AB 3-0 SH 27 (SUTURE) ×3
SUT VIC AB 3-0 SH 27X BRD (SUTURE) ×2 IMPLANT
SUT VIC AB 4-0 PS2 27 (SUTURE) ×3 IMPLANT
SUT VICRYL AB 3-0 FS1 BRD 27IN (SUTURE) ×3 IMPLANT
SYR CONTROL 10ML LL (SYRINGE) ×6 IMPLANT

## 2020-12-24 NOTE — Addendum Note (Signed)
Addendum  created 12/24/20 1109 by Denese Killings, MD   Order list changed, Pharmacy for encounter modified

## 2020-12-24 NOTE — Addendum Note (Signed)
Addendum  created 12/24/20 1120 by Orlie Dakin, CRNA   Child order released for a procedure order, Clinical Note Signed, Intraprocedure Blocks edited, Intraprocedure Event deleted, Intraprocedure Event edited, LDA created via procedure documentation, SmartForm saved

## 2020-12-24 NOTE — Op Note (Signed)
12/24/2020  10:31 AM  PATIENT:  Mark Wiggins  50 y.o. adult  PRE-OPERATIVE DIAGNOSIS:  PAINFUL NONUNION OF RIGHT HALLUS  POST-OPERATIVE DIAGNOSIS:  PAINFUL NONUNION OF RIGHT HALLUS  PROCEDURE:  Procedure(s): HARDWARE REMOVAL RIGHT HALLUX (Right) ARTHRODESIS FOOT RIGHT HALLUX (Right)  SURGEON:  Surgeon(s) and Role:    * Tyson Babinski, DPM - Primary      ASSISTANTS: Caprice Beaver, DPM.  ANESTHESIA:   general  EBL:  None.  BLOOD ADMINISTERED:none  DRAINS: none   LOCAL MEDICATIONS USED:  MARCAINE   , LIDOCAINE , and Amount: 18 ml post op  Materials used: Dynaforce accumed MTP plate, 15mm staple, 2 x3.5 6m screws, 1x 3.5 31mm screw, 1 x 3.5 70mm screw, 2.5cc DBM Putty.  TOURNIQUET:   Total Tourniquet Time Documented: Ankle (Right) - 106 minutes Total: Ankle (Right) - 106 minutes  Patient was brought into the operating room laid supine on the operating table. Ankle tourniquet was applied to the surgical extremity. Following general anesthesia, a local block was achieved using 8 cc of mixture of 1% plain lidocaine with 0.5% marcaine. The foot was the prepped, scrubbed and draped in aseptic manner. Using an esmarch band the tourniquet on the surgical site was inflatted at 262mmHG.    Attention was directed to the dorsal medial aspect of the right first MPJ.  A 7cm dorsal linear longitudinal incision was made medial and parallel to the extensor hallucis longus tendon.  Dissection was continued deep down to the level of the first metatarsophalangeal joint. A linear longitudinal incision was made along the dorsal aspect of the first metatarsophalangeal joint capsule. The capsule was refected off and there was some pseudoarthrosis of the joint noted. The screw was removed from the proximal phalanx. The screw from the first metatarsal head was removed. Rasp was used to rasp down some dorsal exostosis. A sagittal surgical blade was used to cut into the joint. Care was  made to take minimal amount of bone. Small piece of bone from first metatarsal head and base of proximal phalanx was resected. The joint was curetted again and fenestrated with 4'5 Inch Wire.  There was not significant shortening or void noted. Decision was made not use the bone graft from calcaneus. 2.5cc of DBM bone putty was used between the joint At this time A dynaforce MTP plate was applied on the dorsal aspect of the right first MPJ. Fluoroscopy was done to visualize the position of the plate. 69mm staple was inserted in the plate across the joint. The plate was then secured using 3.5 18 screw proximally and distally with 34mm screw and 2mm screw. Compression was noted and visualized at the MPJ. The position of the hallux was rectus and in good alignment compare to second toe. Surgical site was irrigated.The capsule was closed using 2-0 Vicryl. Sub cutaneous tissue closed using 3-0 Vicryl. Skin was closed using 4-0 prolene.    DSD was applied. Tourniquet was deflated. Capillary refill time was brisk to lesser toes. Patient will be nonweightbearing with cam walker and knee scooter.

## 2020-12-24 NOTE — Transfer of Care (Signed)
Immediate Anesthesia Transfer of Care Note  Patient: Mark Wiggins  Procedure(s) Performed: HARDWARE REMOVAL RIGHT HALLUX (Right) ARTHRODESIS FOOT RIGHT HALLUX (Right: Foot)  Patient Location: PACU  Anesthesia Type:General  Level of Consciousness: drowsy  Airway & Oxygen Therapy: Patient Spontanous Breathing and Patient connected to face mask oxygen  Post-op Assessment: Report given to RN and Post -op Vital signs reviewed and stable  Post vital signs: Reviewed and stable  Last Vitals:  Vitals Value Taken Time  BP 114/74 12/24/20 1035  Temp    Pulse 92 12/24/20 1036  Resp 18 12/24/20 1036  SpO2 100 % 12/24/20 1036  Vitals shown include unvalidated device data.  Last Pain:  Vitals:   12/24/20 0720  TempSrc: Oral  PainSc: 0-No pain         Complications: No notable events documented.

## 2020-12-24 NOTE — Anesthesia Procedure Notes (Signed)
Procedure Name: LMA Insertion Date/Time: 12/24/2020 8:28 AM Performed by: Orlie Dakin, CRNA Pre-anesthesia Checklist: Patient identified, Emergency Drugs available, Suction available and Patient being monitored Patient Re-evaluated:Patient Re-evaluated prior to induction Oxygen Delivery Method: Circle system utilized Preoxygenation: Pre-oxygenation with 100% oxygen Induction Type: IV induction LMA: LMA inserted LMA Size: 5.0 Tube type: Oral Number of attempts: 1 Placement Confirmation: positive ETCO2 Tube secured with: Tape Dental Injury: Teeth and Oropharynx as per pre-operative assessment

## 2020-12-24 NOTE — Brief Op Note (Signed)
12/24/2020  10:31 AM  PATIENT:  Ed Blalock  50 y.o. adult  PRE-OPERATIVE DIAGNOSIS:  PAINFUL NONUNION OF RIGHT HALLUS  POST-OPERATIVE DIAGNOSIS:  PAINFUL NONUNION OF RIGHT HALLUS  PROCEDURE:  Procedure(s): HARDWARE REMOVAL RIGHT HALLUX (Right) ARTHRODESIS FOOT RIGHT HALLUX (Right)  SURGEON:  Surgeon(s) and Role:    * Tyson Babinski, DPM - Primary      ASSISTANTS: Caprice Beaver, DPM.  ANESTHESIA:   general  EBL:  None.  BLOOD ADMINISTERED:none  DRAINS: none   LOCAL MEDICATIONS USED:  MARCAINE   , LIDOCAINE , and Amount: 18 ml post op  SPECIMEN:  No Specimen  DISPOSITION OF SPECIMEN:  N/A  COUNTS:  YES  TOURNIQUET:   Total Tourniquet Time Documented: Ankle (Right) - 106 minutes Total: Ankle (Right) - 106 minutes   DICTATION: .Viviann Spare Dictation  PLAN OF CARE: Discharge to home after PACU  PATIENT DISPOSITION:  PACU - hemodynamically stable.   Delay start of Pharmacological VTE agent (>24hrs) due to surgical blood loss or risk of bleeding: N/A

## 2020-12-24 NOTE — Anesthesia Preprocedure Evaluation (Addendum)
Anesthesia Evaluation  Patient identified by MRN, date of birth, ID band Patient awake    Reviewed: Allergy & Precautions, NPO status , Patient's Chart, lab work & pertinent test results  History of Anesthesia Complications (+) PONV and history of anesthetic complications  Airway Mallampati: II  TM Distance: >3 FB Neck ROM: Full    Dental  (+) Dental Advisory Given   Pulmonary sleep apnea (non complaint with CPAP) ,  Right lung cancer   Pulmonary exam normal breath sounds clear to auscultation       Cardiovascular Exercise Tolerance: Good hypertension, Pt. on medications Normal cardiovascular exam Rhythm:Regular Rate:Normal     Neuro/Psych Anxiety  Neuromuscular disease    GI/Hepatic Neg liver ROS, GERD  Medicated,  Endo/Other  diabetes, Well Controlled, Type 2, Oral Hypoglycemic Agents  Renal/GU negative Renal ROS     Musculoskeletal negative musculoskeletal ROS (+)   Abdominal   Peds  Hematology negative hematology ROS (+)   Anesthesia Other Findings   Reproductive/Obstetrics negative OB ROS                             Anesthesia Physical  Anesthesia Plan  ASA: 3  Anesthesia Plan: General   Post-op Pain Management:    Induction: Intravenous  PONV Risk Score and Plan: 4 or greater and Ondansetron, Midazolam, Metaclopromide, Scopolamine patch - Pre-op and Dexamethasone  Airway Management Planned: LMA  Additional Equipment:   Intra-op Plan:   Post-operative Plan:   Informed Consent: I have reviewed the patients History and Physical, chart, labs and discussed the procedure including the risks, benefits and alternatives for the proposed anesthesia with the patient or authorized representative who has indicated his/her understanding and acceptance.     Dental advisory given  Plan Discussed with: CRNA and Surgeon  Anesthesia Plan Comments:        Anesthesia Quick  Evaluation

## 2020-12-24 NOTE — Anesthesia Postprocedure Evaluation (Signed)
Anesthesia Post Note  Patient: Mark Wiggins  Procedure(s) Performed: HARDWARE REMOVAL RIGHT HALLUX (Right) ARTHRODESIS FOOT RIGHT HALLUX (Right: Foot)  Patient location during evaluation: PACU Anesthesia Type: General Level of consciousness: awake and alert and oriented Pain management: pain level controlled Vital Signs Assessment: post-procedure vital signs reviewed and stable Respiratory status: spontaneous breathing, nonlabored ventilation and respiratory function stable Cardiovascular status: blood pressure returned to baseline and stable Postop Assessment: no apparent nausea or vomiting Anesthetic complications: no   No notable events documented.   Last Vitals:  Vitals:   12/24/20 1045 12/24/20 1047  BP: 115/78   Pulse: (!) 102 100  Resp: 18 19  Temp:    SpO2: 100% 100%    Last Pain:  Vitals:   12/24/20 1035  TempSrc:   PainSc: 0-No pain                 Shivani Barrantes C Ma Munoz

## 2020-12-24 NOTE — Discharge Instructions (Signed)

## 2020-12-24 NOTE — H&P (Signed)
.  HISTORY AND PHYSICAL INTERVAL NOTE:  12/24/2020  8:11 AM  Mark Wiggins  has presented today for surgery, with the diagnosis of [place].  The various methods of treatment have been discussed with the patient.  No guarantees were given.  After consideration of risks, benefits and other options for treatment, the patient has consented to surgery.  I have reviewed the patients chart and labs.    Patient Vitals for the past 24 hrs:  BP Temp Temp src Pulse Resp SpO2 Height Weight  12/24/20 0720 (!) 132/93 98.3 F (36.8 C) Oral 97 (!) 21 97 % 5\' 8"  (1.727 m) 108 kg    A history and physical examination was performed in my office.  The patient was reexamined.  There have been no changes to this history and physical examination.  Tyson Babinski, DPM

## 2020-12-25 ENCOUNTER — Encounter (HOSPITAL_COMMUNITY): Payer: Self-pay | Admitting: Podiatry

## 2020-12-25 NOTE — Addendum Note (Signed)
Addendum  created 12/25/20 1331 by Orlie Dakin, CRNA   Intraprocedure Meds edited

## 2022-05-10 IMAGING — RF DG C-ARM 1-60 MIN
1 series · 2 of 2 positions shown · IV contrast (agent unspecified)
Comparison: X-ray 05/05/2020

CLINICAL DATA: Right foot hardware removal and replacement

EXAM:
DG C-ARM 1-60 MIN; RIGHT FOOT - 2 VIEW
CONTRAST:  None.
FLUOROSCOPY TIME:  Fluoroscopy Time:  25 seconds
Number of Acquired Spot Images: 2

[Series 1: run · 2 of 2 slices shown]
[im 1/2]
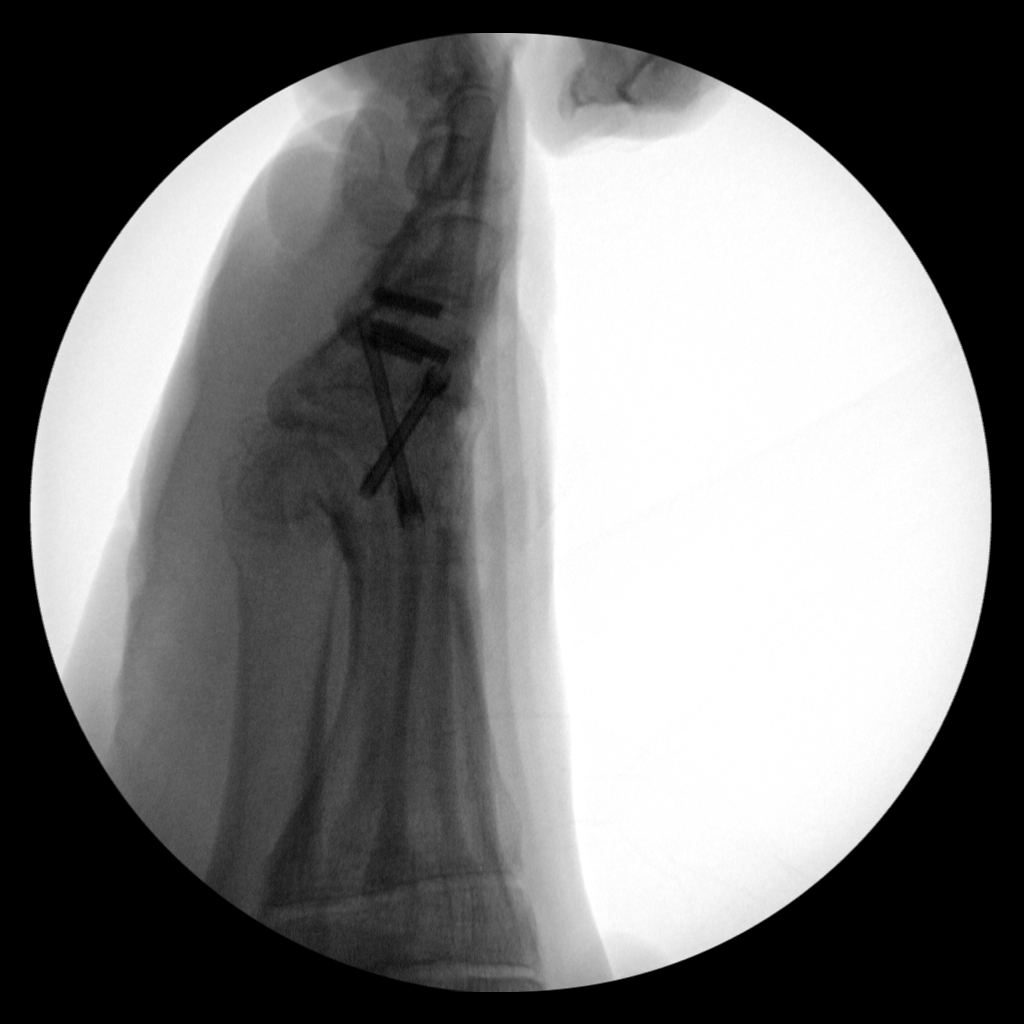
[im 2/2]
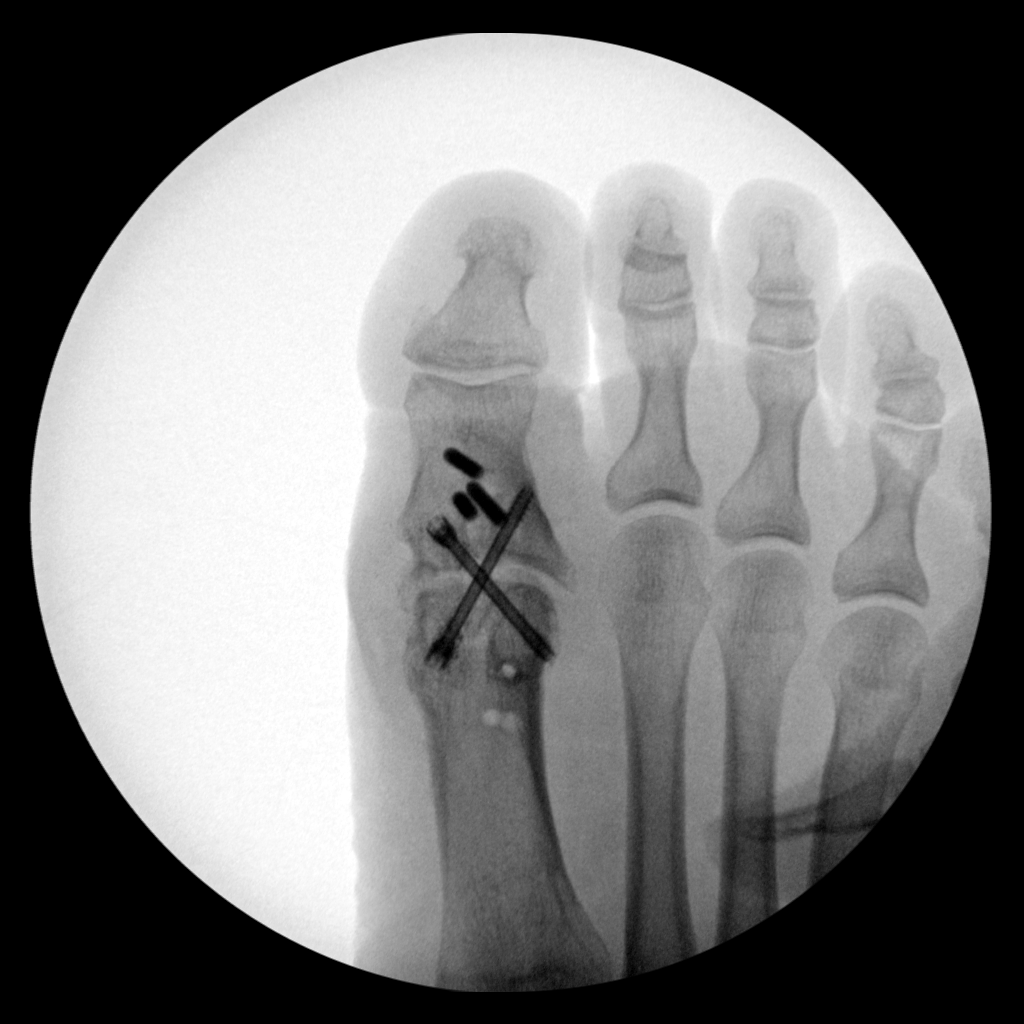

[2 of 2 positions shown; findings below may reference images not displayed]

FINDINGS: 2 C-arm fluoroscopic images were obtained intraoperatively and
submitted for post operative interpretation. Removal of previously
seen dorsal sideplate and screw fixation construct at the first MTP
joint. Placement of 2 partially threaded screws traversing the first
MTP joint. There are 3 retained screw fragments in the great toe
proximal phalanx. Please see the performing provider's procedural
report for further detail.
IMPRESSION: As above.

## 2022-08-24 ENCOUNTER — Other Ambulatory Visit: Payer: Self-pay | Admitting: Podiatry

## 2022-09-03 NOTE — Patient Instructions (Signed)
Mark Wiggins  09/03/2022     @[email or web address] @   Your procedure is scheduled on  09/08/2022.   Report to [facility] at  0745  A.M.   Call this number if you have problems the morning of surgery:  [phone]  If you experience any cold or flu symptoms such as cough, fever, chills, shortness of breath, etc. between now and your scheduled surgery, please notify us at the above number.   Remember:  Do not eat or drink after midnight.    Your last dose of Ozempic should be on 08/31/2022.        DO NOT take any medications for diabetes the morning of your procedure.    Take these medicines the morning of surgery with A SIP OF WATER                     amlodipine, lexapro, omeprazole.     Do not wear jewelry, make-up or nail polish, including gel polish,  artificial nails, or any other type of covering on natural nails (fingers and  toes).  Do not wear lotions, powders, or perfumes, or deodorant.  Do not shave 48 hours prior to surgery.  Men may shave face and neck.  Do not bring valuables to the hospital.  [facility] is not responsible for any belongings or valuables.  Contacts, dentures or bridgework may not be worn into surgery.  Leave your suitcase in the car.  After surgery it may be brought to your room.  For patients admitted to the hospital, discharge time will be determined by your treatment team.  Patients discharged the day of surgery will not be allowed to drive home and must have someone with them for 24 hours.    Special instructions:   DO NOT smoke tobacco or vape for 24 hours before your procedure.  Please read over the following fact sheets that you were given. Coughing and Deep Breathing, Surgical Site Infection Prevention, Anesthesia Post-op Instructions, and Care and Recovery After Surgery        Bunion Surgery, Care After This sheet gives you information about how to care for yourself after your procedure. Your health care  provider may also give you more specific instructions. If you have problems or questions, contact your health care provider. What can I expect after the procedure? After the procedure, it is common to have: Redness. Pain. Swelling. A small amount of fluid coming from your incision. Follow these instructions at home: If you have a post-operative brace, boot, or shoe: Wear the post-op (post-operative) brace, boot, or shoe as told by your health care provider. Remove it only as told by your health care provider. Loosen the brace, boot, or shoe if your toes tingle, become numb, or turn cold and blue. Keep the brace, boot, or shoe clean and dry. If you have a cast: Do not put pressure on any part of the cast until it is fully hardened. This may take several hours. Do not stick anything inside the cast to scratch your skin. Doing that increases your risk of infection. Check the skin around the cast every day. Tell your health care provider about any concerns. You may put lotion on dry skin around the edges of the cast. Do not put lotion on the skin underneath the cast. Keep the cast clean and dry. Bathing Do not take baths, swim, or use a hot tub until your health care provider approves. Ask your  health care provider if you may take showers. If your brace, boot, shoe, or cast is not waterproof: Do not let it get wet. Cover it with a watertight covering when you take a bath or a shower. Keep your bandage (dressing) dry until your health care provider says it can be removed. Incision care  The dressing holds your toe in the correct position. Do not change the dressing until your health care provider approves. Follow instructions from your health care provider about how to take care of your incision. Make sure you: Wash your hands with soap and water for at least 20 seconds before and after you change your dressing. If soap and water are not available, use hand sanitizer. Change your dressing as  told by your health care provider. Leave stitches (sutures), skin glue, or adhesive strips in place. These skin closures may need to stay in place for 2 weeks or longer. If adhesive strip edges start to loosen and curl up, you may trim the loose edges. Do not remove adhesive strips completely unless your health care provider tells you to do that. Check your incision area every day for signs of infection. Check for: More redness, swelling, or pain. Blood or more fluid. Warmth. Pus or a bad smell. Managing pain, stiffness, and swelling  If directed, put ice on the affected area. To do this: If you have a removable brace, boot, or shoe, remove it as told by your health care provider. Put ice in a plastic bag. Place a towel between your skin and the bag or between your cast and the bag. Leave the ice on for 20 minutes, 2-3 times a day. Remove the ice if your skin turns bright red. This is very important. If you cannot feel pain, heat, or cold, you have a greater risk of damage to the area. Move your toes often to reduce stiffness and swelling. Raise (elevate) the injured area above the level of your heart while you are sitting or lying down. Activity Return to your normal activities as told by your health care provider. Ask your health care provider what activities are safe for you. If physical therapy was prescribed, do exercises as told by your health care provider. Driving If you were given a sedative during the procedure, it can affect you for several hours. Do not drive or operate machinery until your health care provider says that it is safe. Ask your health care provider when it is safe to drive if you have a brace, boot, shoe, or cast on your foot. Safety Do not use the affected leg to support (bear) your body weight until your health care provider says that you can. Follow weight-bearing restrictions as told. Use crutches, a cane, or a walker as told by your health care  provider. General instructions Do not use any products that contain nicotine or tobacco, such as cigarettes, e-cigarettes, and chewing tobacco. If you need help quitting, ask your health care provider. Take over-the-counter and prescription medicines only as told by your health care provider. Your medicines may cause constipation. To prevent or treat constipation, you may need to: Drink enough fluid to keep your urine pale yellow. Take over-the-counter or prescription medicines. Eat foods that are high in fiber, such as beans, whole grains, and fresh fruits and vegetables. Limit foods that are high in fat and processed sugars, such as fried or sweet foods. Do not wear high heels or tight-fitting shoes, even after you heal. Keep all follow-up visits. This is  important. Contact a health care provider if: You have any of these signs of infection: More redness, swelling, or pain around your incision. Blood or more fluid coming from your incision. Warmth coming from your incision. Pus or a bad smell coming from your incision. A fever or chills. Your dressing gets wet or it falls off. You have swelling in your lower leg or calf. You have numbness or stiffness in your toes. Get help right away if: You have severe pain. You have shortness of breath or trouble breathing. You have chest pain. You have redness, swelling, pain, or warmth in your calf or leg. These symptoms may represent a serious problem that is an emergency. Do not wait to see if the symptoms will go away. Get medical help right away. Call your local emergency services (911 in the [place] Do not drive yourself to the hospital. Summary If directed, put ice on the affected area. Leave the ice on for 20 minutes, 2-3 times a day. Ask your health care provider when it is safe to drive if you have a brace, boot, shoe, or cast on your foot. Do not use the affected leg to support (bear) your body weight until your health care provider says  that you can. Follow weight-bearing restrictions as told. Use crutches, a cane, or a walker as told by your health care provider. This information is not intended to replace advice given to you by your health care provider. Make sure you discuss any questions you have with your health care provider. Document Revised: 05/03/2019 Document Reviewed: 05/04/2019 Elsevier Patient Education  2024 [employer] Monitored Anesthesia Care, Care After The following information offers guidance on how to care for yourself after your procedure. Your health care provider may also give you more specific instructions. If you have problems or questions, contact your health care provider. What can I expect after the procedure? After the procedure, it is common to have: Tiredness. Little or no memory about what happened during or after the procedure. Impaired judgment when it comes to making decisions. Nausea or vomiting. Some trouble with balance. Follow these instructions at home: For the time period you were told by your health care provider:  Rest. Do not participate in activities where you could fall or become injured. Do not drive or use machinery. Do not drink alcohol. Do not take sleeping pills or medicines that cause drowsiness. Do not make important decisions or sign legal documents. Do not take care of children on your own. Medicines Take over-the-counter and prescription medicines only as told by your health care provider. If you were prescribed antibiotics, take them as told by your health care provider. Do not stop using the antibiotic even if you start to feel better. Eating and drinking Follow instructions from your health care provider about what you may eat and drink. Drink enough fluid to keep your urine pale yellow. If you vomit: Drink clear fluids slowly and in small amounts as you are able. Clear fluids include water, ice chips, low-calorie sports drinks, and fruit juice that has  water added to it (diluted fruit juice). Eat light and bland foods in small amounts as you are able. These foods include bananas, applesauce, rice, lean meats, toast, and crackers. General instructions  Have a responsible adult stay with you for the time you are told. It is important to have someone help care for you until you are awake and alert. If you have sleep apnea, surgery and some medicines can increase your  risk for breathing problems. Follow instructions from your health care provider about wearing your sleep device: When you are sleeping. This includes during daytime naps. While taking prescription pain medicines, sleeping medicines, or medicines that make you drowsy. Do not use any products that contain nicotine or tobacco. These products include cigarettes, chewing tobacco, and vaping devices, such as e-cigarettes. If you need help quitting, ask your health care provider. Contact a health care provider if: You feel nauseous or vomit every time you eat or drink. You feel light-headed. You are still sleepy or having trouble with balance after 24 hours. You get a rash. You have a fever. You have redness or swelling around the IV site. Get help right away if: You have trouble breathing. You have new confusion after you get home. These symptoms may be an emergency. Get help right away. Call 911. Do not wait to see if the symptoms will go away. Do not drive yourself to the hospital. This information is not intended to replace advice given to you by your health care provider. Make sure you discuss any questions you have with your health care provider. Document Revised: 05/25/2021 Document Reviewed: 05/25/2021 Elsevier Patient Education  2024 [employer] How to Use Chlorhexidine Before Surgery Chlorhexidine gluconate (CHG) is a germ-killing (antiseptic) solution that is used to clean the skin. It can get rid of the bacteria that normally live on the skin and can keep them away for  about 24 hours. To clean your skin with CHG, you may be given: A CHG solution to use in the shower or as part of a sponge bath. A prepackaged cloth that contains CHG. Cleaning your skin with CHG may help lower the risk for infection: While you are staying in the intensive care unit of the hospital. If you have a vascular access, such as a central line, to provide short-term or long-term access to your veins. If you have a catheter to drain urine from your bladder. If you are on a ventilator. A ventilator is a machine that helps you breathe by moving air in and out of your lungs. After surgery. What are the risks? Risks of using CHG include: A skin reaction. Hearing loss, if CHG gets in your ears and you have a perforated eardrum. Eye injury, if CHG gets in your eyes and is not rinsed out. The CHG product catching fire. Make sure that you avoid smoking and flames after applying CHG to your skin. Do not use CHG: If you have a chlorhexidine allergy or have previously reacted to chlorhexidine. On babies younger than 80 months of age. How to use CHG solution Use CHG only as told by your health care provider, and follow the instructions on the label. Use the full amount of CHG as directed. Usually, this is one bottle. During a shower Follow these steps when using CHG solution during a shower (unless your health care provider gives you different instructions): Start the shower. Use your normal soap and shampoo to wash your face and hair. Turn off the shower or move out of the shower stream. Pour the CHG onto a clean washcloth. Do not use any type of brush or rough-edged sponge. Starting at your neck, lather your body down to your toes. Make sure you follow these instructions: If you will be having surgery, pay special attention to the part of your body where you will be having surgery. Scrub this area for at least 1 minute. Do not use CHG on your head or  face. If the solution gets into your  ears or eyes, rinse them well with water. Avoid your genital area. Avoid any areas of skin that have broken skin, cuts, or scrapes. Scrub your back and under your arms. Make sure to wash skin folds. Let the lather sit on your skin for 1-2 minutes or as long as told by your health care provider. Thoroughly rinse your entire body in the shower. Make sure that all body creases and crevices are rinsed well. Dry off with a clean towel. Do not put any substances on your body afterward--such as powder, lotion, or perfume--unless you are told to do so by your health care provider. Only use lotions that are recommended by the manufacturer. Put on clean clothes or pajamas. If it is the night before your surgery, sleep in clean sheets.  During a sponge bath Follow these steps when using CHG solution during a sponge bath (unless your health care provider gives you different instructions): Use your normal soap and shampoo to wash your face and hair. Pour the CHG onto a clean washcloth. Starting at your neck, lather your body down to your toes. Make sure you follow these instructions: If you will be having surgery, pay special attention to the part of your body where you will be having surgery. Scrub this area for at least 1 minute. Do not use CHG on your head or face. If the solution gets into your ears or eyes, rinse them well with water. Avoid your genital area. Avoid any areas of skin that have broken skin, cuts, or scrapes. Scrub your back and under your arms. Make sure to wash skin folds. Let the lather sit on your skin for 1-2 minutes or as long as told by your health care provider. Using a different clean, wet washcloth, thoroughly rinse your entire body. Make sure that all body creases and crevices are rinsed well. Dry off with a clean towel. Do not put any substances on your body afterward--such as powder, lotion, or perfume--unless you are told to do so by your health care provider. Only use  lotions that are recommended by the manufacturer. Put on clean clothes or pajamas. If it is the night before your surgery, sleep in clean sheets. How to use CHG prepackaged cloths Only use CHG cloths as told by your health care provider, and follow the instructions on the label. Use the CHG cloth on clean, dry skin. Do not use the CHG cloth on your head or face unless your health care provider tells you to. When washing with the CHG cloth: Avoid your genital area. Avoid any areas of skin that have broken skin, cuts, or scrapes. Before surgery Follow these steps when using a CHG cloth to clean before surgery (unless your health care provider gives you different instructions): Using the CHG cloth, vigorously scrub the part of your body where you will be having surgery. Scrub using a back-and-forth motion for 3 minutes. The area on your body should be completely wet with CHG when you are done scrubbing. Do not rinse. Discard the cloth and let the area air-dry. Do not put any substances on the area afterward, such as powder, lotion, or perfume. Put on clean clothes or pajamas. If it is the night before your surgery, sleep in clean sheets.  For general bathing Follow these steps when using CHG cloths for general bathing (unless your health care provider gives you different instructions). Use a separate CHG cloth for each area of your body.  Make sure you wash between any folds of skin and between your fingers and toes. Wash your body in the following order, switching to a new cloth after each step: The front of your neck, shoulders, and chest. Both of your arms, under your arms, and your hands. Your stomach and groin area, avoiding the genitals. Your right leg and foot. Your left leg and foot. The back of your neck, your back, and your buttocks. Do not rinse. Discard the cloth and let the area air-dry. Do not put any substances on your body afterward--such as powder, lotion, or perfume--unless  you are told to do so by your health care provider. Only use lotions that are recommended by the manufacturer. Put on clean clothes or pajamas. Contact a health care provider if: Your skin gets irritated after scrubbing. You have questions about using your solution or cloth. You swallow any chlorhexidine. Call your local poison control center ([phone] in the [place] Get help right away if: Your eyes itch badly, or they become very red or swollen. Your skin itches badly and is red or swollen. Your hearing changes. You have trouble seeing. You have swelling or tingling in your mouth or throat. You have trouble breathing. These symptoms may represent a serious problem that is an emergency. Do not wait to see if the symptoms will go away. Get medical help right away. Call your local emergency services (911 in the [place] Do not drive yourself to the hospital. Summary Chlorhexidine gluconate (CHG) is a germ-killing (antiseptic) solution that is used to clean the skin. Cleaning your skin with CHG may help to lower your risk for infection. You may be given CHG to use for bathing. It may be in a bottle or in a prepackaged cloth to use on your skin. Carefully follow your health care provider's instructions and the instructions on the product label. Do not use CHG if you have a chlorhexidine allergy. Contact your health care provider if your skin gets irritated after scrubbing. This information is not intended to replace advice given to you by your health care provider. Make sure you discuss any questions you have with your health care provider. Document Revised: 04/27/2021 Document Reviewed: 03/10/2020 Elsevier Patient Education  2023 [employer].

## 2022-09-06 ENCOUNTER — Encounter (HOSPITAL_COMMUNITY)
Admission: RE | Admit: 2022-09-06 | Discharge: 2022-09-06 | Disposition: A | Discharge status: Home / Self Care | Payer: BC Managed Care – PPO | Source: Ambulatory Visit | Attending: Podiatry | Admitting: Podiatry

## 2022-09-06 NOTE — Progress Notes (Signed)
Patient will be cancelled for 09/08/2022. He did not stop his Ozempic and will need to be off the medication for 7 days before procedure.

## 2022-09-17 NOTE — Patient Instructions (Addendum)
DESAI APA  09/17/2022     @PREFPERIOPPHARMACY @   Your procedure is scheduled on  09/22/2022.   Report to [facility] at  0600 A.M.   Call this number if you have problems the morning of surgery:  [phone]  If you experience any cold or flu symptoms such as cough, fever, chills, shortness of breath, etc. between now and your scheduled surgery, please notify us at the above number.   Remember:  Do not eat or drink after midnight.         Your last dose of Ozempic should have been on 09/14/2022.     Take these medicines the morning of surgery with A SIP OF WATER                        amlodipine, zyrtec, lexapro, omeprazole.     Do not wear jewelry, make-up or nail polish, including gel polish,  artificial nails, or any other type of covering on natural nails (fingers and  toes).  Do not wear lotions, powders, or perfumes, or deodorant.  Do not shave 48 hours prior to surgery.  Men may shave face and neck.  Do not bring valuables to the hospital.  [facility] is not responsible for any belongings or valuables.  Contacts, dentures or bridgework may not be worn into surgery.  Leave your suitcase in the car.  After surgery it may be brought to your room.  For patients admitted to the hospital, discharge time will be determined by your treatment team.  Patients discharged the day of surgery will not be allowed to drive home and must have someone with them for 24 hours.    Special instructions:   DO NOT smoke tobacco or vape for 24 hours before your procedure.  Please read over the following fact sheets that you were given. Coughing and Deep Breathing, Surgical Site Infection Prevention, Anesthesia Post-op Instructions, and Care and Recovery After Surgery       Bunion Surgery, Care After This sheet gives you information about how to care for yourself after your procedure. Your health care provider may also give you more specific instructions. If you  have problems or questions, contact your health care provider. What can I expect after the procedure? After the procedure, it is common to have: Redness. Pain. Swelling. A small amount of fluid coming from your incision. Follow these instructions at home: If you have a post-operative brace, boot, or shoe: Wear the post-op (post-operative) brace, boot, or shoe as told by your health care provider. Remove it only as told by your health care provider. Loosen the brace, boot, or shoe if your toes tingle, become numb, or turn cold and blue. Keep the brace, boot, or shoe clean and dry. If you have a cast: Do not put pressure on any part of the cast until it is fully hardened. This may take several hours. Do not stick anything inside the cast to scratch your skin. Doing that increases your risk of infection. Check the skin around the cast every day. Tell your health care provider about any concerns. You may put lotion on dry skin around the edges of the cast. Do not put lotion on the skin underneath the cast. Keep the cast clean and dry. Bathing Do not take baths, swim, or use a hot tub until your health care provider approves. Ask your health care provider if you may take showers. If your  brace, boot, shoe, or cast is not waterproof: Do not let it get wet. Cover it with a watertight covering when you take a bath or a shower. Keep your bandage (dressing) dry until your health care provider says it can be removed. Incision care  The dressing holds your toe in the correct position. Do not change the dressing until your health care provider approves. Follow instructions from your health care provider about how to take care of your incision. Make sure you: Wash your hands with soap and water for at least 20 seconds before and after you change your dressing. If soap and water are not available, use hand sanitizer. Change your dressing as told by your health care provider. Leave stitches (sutures),  skin glue, or adhesive strips in place. These skin closures may need to stay in place for 2 weeks or longer. If adhesive strip edges start to loosen and curl up, you may trim the loose edges. Do not remove adhesive strips completely unless your health care provider tells you to do that. Check your incision area every day for signs of infection. Check for: More redness, swelling, or pain. Blood or more fluid. Warmth. Pus or a bad smell. Managing pain, stiffness, and swelling  If directed, put ice on the affected area. To do this: If you have a removable brace, boot, or shoe, remove it as told by your health care provider. Put ice in a plastic bag. Place a towel between your skin and the bag or between your cast and the bag. Leave the ice on for 20 minutes, 2-3 times a day. Remove the ice if your skin turns bright red. This is very important. If you cannot feel pain, heat, or cold, you have a greater risk of damage to the area. Move your toes often to reduce stiffness and swelling. Raise (elevate) the injured area above the level of your heart while you are sitting or lying down. Activity Return to your normal activities as told by your health care provider. Ask your health care provider what activities are safe for you. If physical therapy was prescribed, do exercises as told by your health care provider. Driving If you were given a sedative during the procedure, it can affect you for several hours. Do not drive or operate machinery until your health care provider says that it is safe. Ask your health care provider when it is safe to drive if you have a brace, boot, shoe, or cast on your foot. Safety Do not use the affected leg to support (bear) your body weight until your health care provider says that you can. Follow weight-bearing restrictions as told. Use crutches, a cane, or a walker as told by your health care provider. General instructions Do not use any products that contain nicotine  or tobacco, such as cigarettes, e-cigarettes, and chewing tobacco. If you need help quitting, ask your health care provider. Take over-the-counter and prescription medicines only as told by your health care provider. Your medicines may cause constipation. To prevent or treat constipation, you may need to: Drink enough fluid to keep your urine pale yellow. Take over-the-counter or prescription medicines. Eat foods that are high in fiber, such as beans, whole grains, and fresh fruits and vegetables. Limit foods that are high in fat and processed sugars, such as fried or sweet foods. Do not wear high heels or tight-fitting shoes, even after you heal. Keep all follow-up visits. This is important. Contact a health care provider if: You have any  of these signs of infection: More redness, swelling, or pain around your incision. Blood or more fluid coming from your incision. Warmth coming from your incision. Pus or a bad smell coming from your incision. A fever or chills. Your dressing gets wet or it falls off. You have swelling in your lower leg or calf. You have numbness or stiffness in your toes. Get help right away if: You have severe pain. You have shortness of breath or trouble breathing. You have chest pain. You have redness, swelling, pain, or warmth in your calf or leg. These symptoms may represent a serious problem that is an emergency. Do not wait to see if the symptoms will go away. Get medical help right away. Call your local emergency services (911 in the [place] Do not drive yourself to the hospital. Summary If directed, put ice on the affected area. Leave the ice on for 20 minutes, 2-3 times a day. Ask your health care provider when it is safe to drive if you have a brace, boot, shoe, or cast on your foot. Do not use the affected leg to support (bear) your body weight until your health care provider says that you can. Follow weight-bearing restrictions as told. Use crutches, a  cane, or a walker as told by your health care provider. This information is not intended to replace advice given to you by your health care provider. Make sure you discuss any questions you have with your health care provider. Document Revised: 05/03/2019 Document Reviewed: 05/04/2019 Elsevier Patient Education  2024 [employer]  General Anesthesia, Adult, Care After The following information offers guidance on how to care for yourself after your procedure. Your health care provider may also give you more specific instructions. If you have problems or questions, contact your health care provider. What can I expect after the procedure? After the procedure, it is common for people to: Have pain or discomfort at the IV site. Have nausea or vomiting. Have a sore throat or hoarseness. Have trouble concentrating. Feel cold or chills. Feel weak, sleepy, or tired (fatigue). Have soreness and body aches. These can affect parts of the body that were not involved in surgery. Follow these instructions at home: For the time period you were told by your health care provider:  Rest. Do not participate in activities where you could fall or become injured. Do not drive or use machinery. Do not drink alcohol. Do not take sleeping pills or medicines that cause drowsiness. Do not make important decisions or sign legal documents. Do not take care of children on your own. General instructions Drink enough fluid to keep your urine pale yellow. If you have sleep apnea, surgery and certain medicines can increase your risk for breathing problems. Follow instructions from your health care provider about wearing your sleep device: Anytime you are sleeping, including during daytime naps. While taking prescription pain medicines, sleeping medicines, or medicines that make you drowsy. Return to your normal activities as told by your health care provider. Ask your health care provider what activities are safe for  you. Take over-the-counter and prescription medicines only as told by your health care provider. Do not use any products that contain nicotine or tobacco. These products include cigarettes, chewing tobacco, and vaping devices, such as e-cigarettes. These can delay incision healing after surgery. If you need help quitting, ask your health care provider. Contact a health care provider if: You have nausea or vomiting that does not get better with medicine. You vomit every time  you eat or drink. You have pain that does not get better with medicine. You cannot urinate or have bloody urine. You develop a skin rash. You have a fever. Get help right away if: You have trouble breathing. You have chest pain. You vomit blood. These symptoms may be an emergency. Get help right away. Call 911. Do not wait to see if the symptoms will go away. Do not drive yourself to the hospital. Summary After the procedure, it is common to have a sore throat, hoarseness, nausea, vomiting, or to feel weak, sleepy, or fatigue. For the time period you were told by your health care provider, do not drive or use machinery. Get help right away if you have difficulty breathing, have chest pain, or vomit blood. These symptoms may be an emergency. This information is not intended to replace advice given to you by your health care provider. Make sure you discuss any questions you have with your health care provider. Document Revised: 03/27/2021 Document Reviewed: 03/27/2021 Elsevier Patient Education  2024 [employer] Inc. How to Use Chlorhexidine Before Surgery Chlorhexidine gluconate (CHG) is a germ-killing (antiseptic) solution that is used to clean the skin. It can get rid of the bacteria that normally live on the skin and can keep them away for about 24 hours. To clean your skin with CHG, you may be given: A CHG solution to use in the shower or as part of a sponge bath. A prepackaged cloth that contains CHG. Cleaning your  skin with CHG may help lower the risk for infection: While you are staying in the intensive care unit of the hospital. If you have a vascular access, such as a central line, to provide short-term or long-term access to your veins. If you have a catheter to drain urine from your bladder. If you are on a ventilator. A ventilator is a machine that helps you breathe by moving air in and out of your lungs. After surgery. What are the risks? Risks of using CHG include: A skin reaction. Hearing loss, if CHG gets in your ears and you have a perforated eardrum. Eye injury, if CHG gets in your eyes and is not rinsed out. The CHG product catching fire. Make sure that you avoid smoking and flames after applying CHG to your skin. Do not use CHG: If you have a chlorhexidine allergy or have previously reacted to chlorhexidine. On babies younger than 51 months of age. How to use CHG solution Use CHG only as told by your health care provider, and follow the instructions on the label. Use the full amount of CHG as directed. Usually, this is one bottle. During a shower Follow these steps when using CHG solution during a shower (unless your health care provider gives you different instructions): Start the shower. Use your normal soap and shampoo to wash your face and hair. Turn off the shower or move out of the shower stream. Pour the CHG onto a clean washcloth. Do not use any type of brush or rough-edged sponge. Starting at your neck, lather your body down to your toes. Make sure you follow these instructions: If you will be having surgery, pay special attention to the part of your body where you will be having surgery. Scrub this area for at least 1 minute. Do not use CHG on your head or face. If the solution gets into your ears or eyes, rinse them well with water. Avoid your genital area. Avoid any areas of skin that have broken skin, cuts,  or scrapes. Scrub your back and under your arms. Make sure to  wash skin folds. Let the lather sit on your skin for 1-2 minutes or as long as told by your health care provider. Thoroughly rinse your entire body in the shower. Make sure that all body creases and crevices are rinsed well. Dry off with a clean towel. Do not put any substances on your body afterward--such as powder, lotion, or perfume--unless you are told to do so by your health care provider. Only use lotions that are recommended by the manufacturer. Put on clean clothes or pajamas. If it is the night before your surgery, sleep in clean sheets.  During a sponge bath Follow these steps when using CHG solution during a sponge bath (unless your health care provider gives you different instructions): Use your normal soap and shampoo to wash your face and hair. Pour the CHG onto a clean washcloth. Starting at your neck, lather your body down to your toes. Make sure you follow these instructions: If you will be having surgery, pay special attention to the part of your body where you will be having surgery. Scrub this area for at least 1 minute. Do not use CHG on your head or face. If the solution gets into your ears or eyes, rinse them well with water. Avoid your genital area. Avoid any areas of skin that have broken skin, cuts, or scrapes. Scrub your back and under your arms. Make sure to wash skin folds. Let the lather sit on your skin for 1-2 minutes or as long as told by your health care provider. Using a different clean, wet washcloth, thoroughly rinse your entire body. Make sure that all body creases and crevices are rinsed well. Dry off with a clean towel. Do not put any substances on your body afterward--such as powder, lotion, or perfume--unless you are told to do so by your health care provider. Only use lotions that are recommended by the manufacturer. Put on clean clothes or pajamas. If it is the night before your surgery, sleep in clean sheets. How to use CHG prepackaged cloths Only  use CHG cloths as told by your health care provider, and follow the instructions on the label. Use the CHG cloth on clean, dry skin. Do not use the CHG cloth on your head or face unless your health care provider tells you to. When washing with the CHG cloth: Avoid your genital area. Avoid any areas of skin that have broken skin, cuts, or scrapes. Before surgery Follow these steps when using a CHG cloth to clean before surgery (unless your health care provider gives you different instructions): Using the CHG cloth, vigorously scrub the part of your body where you will be having surgery. Scrub using a back-and-forth motion for 3 minutes. The area on your body should be completely wet with CHG when you are done scrubbing. Do not rinse. Discard the cloth and let the area air-dry. Do not put any substances on the area afterward, such as powder, lotion, or perfume. Put on clean clothes or pajamas. If it is the night before your surgery, sleep in clean sheets.  For general bathing Follow these steps when using CHG cloths for general bathing (unless your health care provider gives you different instructions). Use a separate CHG cloth for each area of your body. Make sure you wash between any folds of skin and between your fingers and toes. Wash your body in the following order, switching to a new cloth after each  step: The front of your neck, shoulders, and chest. Both of your arms, under your arms, and your hands. Your stomach and groin area, avoiding the genitals. Your right leg and foot. Your left leg and foot. The back of your neck, your back, and your buttocks. Do not rinse. Discard the cloth and let the area air-dry. Do not put any substances on your body afterward--such as powder, lotion, or perfume--unless you are told to do so by your health care provider. Only use lotions that are recommended by the manufacturer. Put on clean clothes or pajamas. Contact a health care provider if: Your skin  gets irritated after scrubbing. You have questions about using your solution or cloth. You swallow any chlorhexidine. Call your local poison control center (775-775-9826 in the [place] Get help right away if: Your eyes itch badly, or they become very red or swollen. Your skin itches badly and is red or swollen. Your hearing changes. You have trouble seeing. You have swelling or tingling in your mouth or throat. You have trouble breathing. These symptoms may represent a serious problem that is an emergency. Do not wait to see if the symptoms will go away. Get medical help right away. Call your local emergency services (911 in the [place] Do not drive yourself to the hospital. Summary Chlorhexidine gluconate (CHG) is a germ-killing (antiseptic) solution that is used to clean the skin. Cleaning your skin with CHG may help to lower your risk for infection. You may be given CHG to use for bathing. It may be in a bottle or in a prepackaged cloth to use on your skin. Carefully follow your health care provider's instructions and the instructions on the product label. Do not use CHG if you have a chlorhexidine allergy. Contact your health care provider if your skin gets irritated after scrubbing. This information is not intended to replace advice given to you by your health care provider. Make sure you discuss any questions you have with your health care provider. Document Revised: 04/27/2021 Document Reviewed: 03/10/2020 Elsevier Patient Education  2023 [employer].

## 2022-09-20 ENCOUNTER — Encounter (HOSPITAL_COMMUNITY)
Admission: RE | Admit: 2022-09-20 | Discharge: 2022-09-20 | Disposition: A | Discharge status: Home / Self Care | Payer: BC Managed Care – PPO | Source: Ambulatory Visit | Attending: Podiatry | Admitting: Podiatry

## 2022-09-20 ENCOUNTER — Encounter (HOSPITAL_COMMUNITY): Payer: Self-pay

## 2022-09-20 ENCOUNTER — Ambulatory Visit (HOSPITAL_COMMUNITY)
Admission: RE | Admit: 2022-09-20 | Discharge: 2022-09-20 | Disposition: A | Discharge status: Home / Self Care | Payer: BC Managed Care – PPO | Source: Ambulatory Visit | Attending: Podiatry | Admitting: Podiatry

## 2022-09-20 VITALS — BP 151/90 | HR 103 | Temp 97.6°F | Resp 18 | Ht 66.0 in | Wt 238.1 lb

## 2022-09-20 DIAGNOSIS — E0800 Diabetes mellitus due to underlying condition with hyperosmolarity without nonketotic hyperglycemic-hyperosmolar coma (NKHHC): Secondary | ICD-10-CM | POA: Diagnosis present

## 2022-09-20 DIAGNOSIS — M2022 Hallux rigidus, left foot: Secondary | ICD-10-CM | POA: Insufficient documentation

## 2022-09-20 DIAGNOSIS — M79672 Pain in left foot: Secondary | ICD-10-CM | POA: Diagnosis not present

## 2022-09-20 DIAGNOSIS — I10 Essential (primary) hypertension: Secondary | ICD-10-CM

## 2022-09-20 DIAGNOSIS — M19072 Primary osteoarthritis, left ankle and foot: Secondary | ICD-10-CM | POA: Diagnosis not present

## 2022-09-20 DIAGNOSIS — M79675 Pain in left toe(s): Secondary | ICD-10-CM | POA: Diagnosis not present

## 2022-09-22 ENCOUNTER — Ambulatory Visit (HOSPITAL_COMMUNITY): Payer: BC Managed Care – PPO

## 2022-09-22 ENCOUNTER — Ambulatory Visit (HOSPITAL_COMMUNITY)
Admission: RE | Admit: 2022-09-22 | Discharge: 2022-09-22 | Disposition: A | Discharge status: Home / Self Care | Payer: BC Managed Care – PPO | Attending: Podiatry | Admitting: Podiatry

## 2022-09-22 ENCOUNTER — Encounter (HOSPITAL_COMMUNITY): Payer: Self-pay

## 2022-09-22 ENCOUNTER — Encounter (HOSPITAL_COMMUNITY)
Admission: RE | Disposition: A | Discharge status: Home / Self Care | Payer: Self-pay | Source: Home / Self Care | Attending: Podiatry

## 2022-09-22 ENCOUNTER — Ambulatory Visit (HOSPITAL_COMMUNITY): Payer: BC Managed Care – PPO | Admitting: Certified Registered"

## 2022-09-22 ENCOUNTER — Ambulatory Visit (HOSPITAL_COMMUNITY): Payer: Self-pay | Admitting: Certified Registered"

## 2022-09-22 DIAGNOSIS — Z91148 Patient's other noncompliance with medication regimen for other reason: Secondary | ICD-10-CM | POA: Insufficient documentation

## 2022-09-22 DIAGNOSIS — M2022 Hallux rigidus, left foot: Secondary | ICD-10-CM | POA: Diagnosis present

## 2022-09-22 DIAGNOSIS — I10 Essential (primary) hypertension: Secondary | ICD-10-CM | POA: Insufficient documentation

## 2022-09-22 DIAGNOSIS — E119 Type 2 diabetes mellitus without complications: Secondary | ICD-10-CM | POA: Diagnosis not present

## 2022-09-22 DIAGNOSIS — Z7985 Long-term (current) use of injectable non-insulin antidiabetic drugs: Secondary | ICD-10-CM | POA: Diagnosis not present

## 2022-09-22 DIAGNOSIS — G4730 Sleep apnea, unspecified: Secondary | ICD-10-CM | POA: Diagnosis not present

## 2022-09-22 DIAGNOSIS — F419 Anxiety disorder, unspecified: Secondary | ICD-10-CM | POA: Insufficient documentation

## 2022-09-22 DIAGNOSIS — Z6838 Body mass index (BMI) 38.0-38.9, adult: Secondary | ICD-10-CM | POA: Diagnosis not present

## 2022-09-22 HISTORY — PX: HALLUX FUSION: SHX6621

## 2022-09-22 LAB — GLUCOSE, CAPILLARY
Glucose-Capillary: 158 mg/dL — ABNORMAL HIGH (ref 70–99)
Glucose-Capillary: 159 mg/dL — ABNORMAL HIGH (ref 70–99)

## 2022-09-22 SURGERY — FUSION, JOINT, GREAT TOE
Anesthesia: General | Laterality: Left

## 2022-09-22 MED ORDER — PHENYLEPHRINE 80 MCG/ML (10ML) SYRINGE FOR IV PUSH (FOR BLOOD PRESSURE SUPPORT)
PREFILLED_SYRINGE | INTRAVENOUS | Status: AC
  Filled 2022-09-22: qty 10

## 2022-09-22 MED ORDER — ONDANSETRON HCL 4 MG/2ML IJ SOLN
INTRAMUSCULAR | Status: DC | PRN
  Administered 2022-09-22: 4 mg via INTRAVENOUS

## 2022-09-22 MED ORDER — CHLORHEXIDINE GLUCONATE CLOTH 2 % EX PADS: 6.0000 | MEDICATED_PAD | Freq: Once | CUTANEOUS | Status: DC

## 2022-09-22 MED ORDER — LIDOCAINE 2% (20 MG/ML) 5 ML SYRINGE
INTRAMUSCULAR | Status: DC | PRN
  Administered 2022-09-22: 50 mg via INTRAVENOUS

## 2022-09-22 MED ORDER — PROPOFOL 10 MG/ML IV BOLUS
INTRAVENOUS | Status: AC
  Filled 2022-09-22: qty 20

## 2022-09-22 MED ORDER — FENTANYL CITRATE (PF) 100 MCG/2ML IJ SOLN
INTRAMUSCULAR | Status: AC
  Filled 2022-09-22: qty 2

## 2022-09-22 MED ORDER — PROPOFOL 500 MG/50ML IV EMUL
INTRAVENOUS | Status: DC | PRN
  Administered 2022-09-22: 75 ug/kg/min via INTRAVENOUS

## 2022-09-22 MED ORDER — LIDOCAINE HCL (PF) 1 % IJ SOLN
INTRAMUSCULAR | Status: AC
  Filled 2022-09-22: qty 30

## 2022-09-22 MED ORDER — DEXMEDETOMIDINE HCL IN NACL 80 MCG/20ML IV SOLN
INTRAVENOUS | Status: DC | PRN
Start: 2022-09-22 — End: 2022-09-22
  Administered 2022-09-22 (×3): 8 ug via INTRAVENOUS

## 2022-09-22 MED ORDER — PROPOFOL 500 MG/50ML IV EMUL
INTRAVENOUS | Status: AC
  Filled 2022-09-22: qty 50

## 2022-09-22 MED ORDER — PHENYLEPHRINE 80 MCG/ML (10ML) SYRINGE FOR IV PUSH (FOR BLOOD PRESSURE SUPPORT)
PREFILLED_SYRINGE | INTRAVENOUS | Status: DC | PRN
Start: 2022-09-22 — End: 2022-09-22
  Administered 2022-09-22 (×11): 160 ug via INTRAVENOUS

## 2022-09-22 MED ORDER — LIDOCAINE HCL 1 % IJ SOLN
INTRAMUSCULAR | Status: DC | PRN
  Administered 2022-09-22: 18 mL via INTRAMUSCULAR

## 2022-09-22 MED ORDER — ORAL CARE MOUTH RINSE: 15.0000 mL | Freq: Once | OROMUCOSAL | Status: AC

## 2022-09-22 MED ORDER — MIDAZOLAM HCL 5 MG/5ML IJ SOLN
INTRAMUSCULAR | Status: DC | PRN
  Administered 2022-09-22: 2 mg via INTRAVENOUS

## 2022-09-22 MED ORDER — CLINDAMYCIN PHOSPHATE 900 MG/50ML IV SOLN
900.0000 mg | INTRAVENOUS | Status: AC
  Administered 2022-09-22: 900 mg via INTRAVENOUS
  Filled 2022-09-22: qty 50

## 2022-09-22 MED ORDER — EPHEDRINE 5 MG/ML INJ
INTRAVENOUS | Status: AC
  Filled 2022-09-22: qty 5

## 2022-09-22 MED ORDER — CHLORHEXIDINE GLUCONATE 0.12 % MT SOLN
15.0000 mL | Freq: Once | OROMUCOSAL | Status: AC
  Administered 2022-09-22: 15 mL via OROMUCOSAL

## 2022-09-22 MED ORDER — BUPIVACAINE HCL (PF) 0.5 % IJ SOLN
INTRAMUSCULAR | Status: AC
  Filled 2022-09-22: qty 30

## 2022-09-22 MED ORDER — MIDAZOLAM HCL 2 MG/2ML IJ SOLN
INTRAMUSCULAR | Status: AC
  Filled 2022-09-22: qty 2

## 2022-09-22 MED ORDER — FENTANYL CITRATE (PF) 100 MCG/2ML IJ SOLN
INTRAMUSCULAR | Status: DC | PRN
  Administered 2022-09-22 (×4): 50 ug via INTRAVENOUS

## 2022-09-22 MED ORDER — EPHEDRINE SULFATE-NACL 50-0.9 MG/10ML-% IV SOSY
PREFILLED_SYRINGE | INTRAVENOUS | Status: DC | PRN
Start: 2022-09-22 — End: 2022-09-22
  Administered 2022-09-22 (×3): 5 mg via INTRAVENOUS

## 2022-09-22 MED ORDER — LACTATED RINGERS IV SOLN: INTRAVENOUS | Status: DC

## 2022-09-22 MED ORDER — ONDANSETRON HCL 4 MG/2ML IJ SOLN
INTRAMUSCULAR | Status: AC
  Filled 2022-09-22: qty 2

## 2022-09-22 MED ORDER — DEXMEDETOMIDINE HCL IN NACL 80 MCG/20ML IV SOLN
INTRAVENOUS | Status: AC
  Filled 2022-09-22: qty 20

## 2022-09-22 MED ORDER — LIDOCAINE HCL (PF) 2 % IJ SOLN
INTRAMUSCULAR | Status: AC
  Filled 2022-09-22: qty 5

## 2022-09-22 MED ORDER — PROPOFOL 10 MG/ML IV BOLUS
INTRAVENOUS | Status: DC | PRN
  Administered 2022-09-22: 100 mg via INTRAVENOUS
  Administered 2022-09-22: 30 mg via INTRAVENOUS

## 2022-09-22 MED ORDER — 0.9 % SODIUM CHLORIDE (POUR BTL) OPTIME
TOPICAL | Status: DC | PRN
  Administered 2022-09-22: 1000 mL

## 2022-09-22 SURGICAL SUPPLY — 66 items
APL PRP STRL LF DISP 70% ISPRP (MISCELLANEOUS) ×1
APL SKNCLS STERI-STRIP NONHPOA (GAUZE/BANDAGES/DRESSINGS) ×1
BANDAGE ESMARK 4X12 BL STRL LF (DISPOSABLE) ×1 IMPLANT
BENZOIN TINCTURE PRP APPL 2/3 (GAUZE/BANDAGES/DRESSINGS) ×1 IMPLANT
BIT DRILL AT3 STD AC 3 (DRILL) IMPLANT
BIT DRILL LEOS 2.4 (BIT) IMPLANT
BIT DRILL LEOS SN 2.0 (DRILL) IMPLANT
BIT DRILL SUBCHNDRL LEOS SN2.0 (DRILL) IMPLANT
BLADE AVERAGE 25X9 (BLADE) ×1 IMPLANT
BLADE SURG 15 STRL LF DISP TIS (BLADE) ×2 IMPLANT
BLADE SURG 15 STRL SS (BLADE) ×2
BNDG CMPR 12X4 ELC STRL LF (DISPOSABLE) ×1
BNDG CMPR STD VLCR NS LF 5.8X4 (GAUZE/BANDAGES/DRESSINGS) ×1
BNDG CONFORM 2 STRL LF (GAUZE/BANDAGES/DRESSINGS) ×1 IMPLANT
BNDG ELASTIC 4X5.8 VLCR NS LF (GAUZE/BANDAGES/DRESSINGS) ×1 IMPLANT
BNDG ESMARK 4X12 BLUE STRL LF (DISPOSABLE) ×1
BNDG GAUZE ELAST 4 BULKY (GAUZE/BANDAGES/DRESSINGS) ×1 IMPLANT
BOOT STEPPER DURA XLG (SOFTGOODS) IMPLANT
BUR FAST CUTTING (BURR) ×2
BUR SRGRND 54.5X2.4X8 (BURR) IMPLANT
BUR SRGRND 54.5X3.2X8 (BURR) IMPLANT
BURR SRGRND 54.5X2.4X8 (BURR) ×1
BURR SRGRND 54.5X3.2X8 (BURR) ×1
CHLORAPREP W/TINT 26 (MISCELLANEOUS) ×1 IMPLANT
CLOTH BEACON ORANGE TIMEOUT ST (SAFETY) ×1 IMPLANT
COVER LIGHT HANDLE STERIS (MISCELLANEOUS) ×2 IMPLANT
CUFF TOURN SGL QUICK 18X4 (TOURNIQUET CUFF) IMPLANT
DECANTER SPIKE VIAL GLASS SM (MISCELLANEOUS) ×1 IMPLANT
DRAPE OEC MINIVIEW 54X84 (DRAPES) ×1 IMPLANT
DRILL AT3 STD AC 3 (DRILL) ×1
DRILL LEOS SN 2.0 (DRILL) ×1
DRILL SUBCHONDRAL LEOS SN 2.0 (DRILL) ×1
ELECT REM PT RETURN 9FT ADLT (ELECTROSURGICAL) ×1
ELECTRODE REM PT RTRN 9FT ADLT (ELECTROSURGICAL) ×1 IMPLANT
GAUZE 4X4 16PLY ~~LOC~~+RFID DBL (SPONGE) ×1 IMPLANT
GAUZE SPONGE 4X4 12PLY STRL (GAUZE/BANDAGES/DRESSINGS) ×1 IMPLANT
GLOVE BIO SURGEON STRL SZ7.5 (GLOVE) ×1 IMPLANT
GLOVE BIOGEL PI IND STRL 7.0 (GLOVE) ×2 IMPLANT
GLOVE BIOGEL PI IND STRL 7.5 (GLOVE) ×1 IMPLANT
GLOVE ECLIPSE 7.0 STRL STRAW (GLOVE) ×1 IMPLANT
GOWN STRL REUS W/ TWL LRG LVL3 (GOWN DISPOSABLE) ×1 IMPLANT
GOWN STRL REUS W/TWL LRG LVL3 (GOWN DISPOSABLE) ×3 IMPLANT
MANIFOLD NEPTUNE II (INSTRUMENTS) ×1 IMPLANT
NDL HYPO 27GX1-1/4 (NEEDLE) ×3 IMPLANT
NEEDLE HYPO 27GX1-1/4 (NEEDLE) ×3 IMPLANT
NS IRRIG 1000ML POUR BTL (IV SOLUTION) ×1 IMPLANT
PACK BASIC LIMB (CUSTOM PROCEDURE TRAY) ×1 IMPLANT
PAD ARMBOARD 7.5X6 YLW CONV (MISCELLANEOUS) ×1 IMPLANT
PLATE LEOS MTP LT 5D SM (Plate) IMPLANT
PLATE TACK LEOS 1.6X10 (Plate) IMPLANT
POSITIONER HEAD 8X9X4 ADT (SOFTGOODS) ×1 IMPLANT
RASP SM TEAR CROSS CUT (RASP) IMPLANT
SCREW LEOS LOCK 3.5X16 (Screw) IMPLANT
SCREW LEOS NL 3.5X16 (Screw) IMPLANT
SCREW LOCK 2.7 X 14 (Plate) IMPLANT
SCREW LOCK VA LEOS 3.5X14 (Screw) IMPLANT
SCREW NLOCK LEOS 3.5X18 (Screw) IMPLANT
SCREW STD AT3 X 30 (Screw) IMPLANT
SET BASIN LINEN APH (SET/KITS/TRAYS/PACK) ×1 IMPLANT
SUT PROLENE 4 0 PS 2 18 (SUTURE) IMPLANT
SUT VIC AB 2-0 CT2 27 (SUTURE) IMPLANT
SUT VIC AB 4-0 PS2 27 (SUTURE) ×1 IMPLANT
SUT VICRYL AB 3-0 FS1 BRD 27IN (SUTURE) ×1 IMPLANT
SYR CONTROL 10ML LL (SYRINGE) ×2 IMPLANT
WIRE GUIDE SINGLE TROC 1.1X150 (WIRE) IMPLANT
WIRE GUIDE TROCAR 1.1X150 (WIRE) IMPLANT

## 2022-09-22 NOTE — H&P (Signed)
.  HISTORY AND PHYSICAL INTERVAL NOTE:  09/22/2022  8:01 AM  Mark Wiggins  has presented today for surgery, with the diagnosis of LEFT HALLUS RIGIDUS.  The various methods of treatment have been discussed with the patient.  No guarantees were given.  After consideration of risks, benefits and other options for treatment, the patient has consented to surgery.  I have reviewed the patients' chart and labs.    Patient Vitals for the past 24 hrs:  BP Temp Temp src Pulse Resp SpO2  09/22/22 0646 122/80 98.2 F (36.8 C) Oral 85 18 97 %    A history and physical examination was performed in my office.  The patient was reexamined.  There have been no changes to this history and physical examination.  Mark Wiggins, DPM

## 2022-09-22 NOTE — Transfer of Care (Signed)
Immediate Anesthesia Transfer of Care Note  Patient: Mark Wiggins  Procedure(s) Performed: FIRST MPJ HALLUX FUSION LEFT (Left)  Patient Location: PACU  Anesthesia Type:General  Level of Consciousness: awake, alert , and oriented  Airway & Oxygen Therapy: Patient Spontanous Breathing  Post-op Assessment: Report given to RN and Post -op Vital signs reviewed and stable  Post vital signs: Reviewed and stable  Last Vitals:  Vitals Value Taken Time  BP    Temp    Pulse 85 09/22/22 1041  Resp 15 09/22/22 1041  SpO2 98 % 09/22/22 1041  Vitals shown include unfiled device data.  Last Pain:  Vitals:   09/22/22 0646  TempSrc: Oral  PainSc: 3       Patients Stated Pain Goal: 6 (09/22/22 4540)  Complications: No notable events documented.

## 2022-09-22 NOTE — Anesthesia Procedure Notes (Signed)
Date/Time: 09/22/2022 8:06 AM  Performed by: Julian Reil, CRNAPre-anesthesia Checklist: Patient identified, Emergency Drugs available, Suction available and Patient being monitored Patient Re-evaluated:Patient Re-evaluated prior to induction Oxygen Delivery Method: Simple face mask Induction Type: IV induction Placement Confirmation: positive ETCO2

## 2022-09-22 NOTE — Brief Op Note (Signed)
09/22/2022  10:43 AM  PATIENT:  Mark Wiggins  52 y.o. adult  PRE-OPERATIVE DIAGNOSIS:  LEFT HALLUS RIGIDUS  POST-OPERATIVE DIAGNOSIS:  LEFT HALLUS RIGIDUS  PROCEDURE:  Procedure(s): FIRST MPJ HALLUX FUSION LEFT (Left)  SURGEON:  Surgeons and Role:    * Erskine Emery, DPM - Primary  PHYSICIAN ASSISTANT:   ASSISTANTS: none   ANESTHESIA:   local and MAC  EBL:  <3 cc  BLOOD ADMINISTERED:none  DRAINS: none   LOCAL MEDICATIONS USED:  MARCAINE   , LIDOCAINE , and Amount: 18 ml  SPECIMEN:  No Specimen  DISPOSITION OF SPECIMEN:  N/A  COUNTS:  YES  TOURNIQUET:  Ankle tourniquet: 125 minute.   DICTATION: Office manager  PLAN OF CARE: Discharge to home after PACU  PATIENT DISPOSITION:  PACU - hemodynamically stable.   Delay start of Pharmacological VTE agent (>24hrs) due to surgical blood loss or risk of bleeding: no

## 2022-09-22 NOTE — Discharge Instructions (Signed)
These instructions will give you an idea of what to expect after surgery and how to manage issues that may arise before your first post op office visit.  Pain Management Pain is best managed by "staying ahead" of it. If pain gets out of control, it is difficult to get it back under control. Local anesthesia that lasts 6-8 hours is used to numb the foot and decrease pain.  For the best pain control, take the pain medication every 4 hours for the first 2 days post op. On the third day pain medication can be taken as needed.   Post Op Nausea Nausea is common after surgery, so it is managed proactively.  If prescribed, use the prescribed nausea medication regularly for the first 2 days post op.  Bandages Do not worry if there is blood on the bandage. What looks like a lot of blood on the bandage is actually a small amount. Blood on the dressing spreads out as it is absorbed by the gauze, the same way a drop of water spreads out on a paper towel.  If the bandages feel wet or dry, stiff and uncomfortable, call the office during office hours and we will schedule a time for you to have the bandage changed.  Unless you are specifically told otherwise, we will do the first bandage change in the office.  Keep your bandage dry. If the bandage becomes wet or soiled, notify the office and we will schedule a time to change the bandage.  Activity It is best to spend most of the first 2 days after surgery lying down with the foot elevated above the level of your heart. You may put weight on your heel while wearing the surgical shoe.   You may only get up to go to the restroom.  Driving Do not drive until you are able to respond in an emergency (i.e. slam on the brakes). This usually occurs after the bone has healed - 6 to 8 weeks.  Call the Office If you have a fever over 101F.  If you have increasing pain after the initial post op pain has settled down.  If you have increasing redness, swelling, or  drainage.  If you have any questions or concerns.   

## 2022-09-22 NOTE — Anesthesia Preprocedure Evaluation (Signed)
Anesthesia Evaluation  Patient identified by MRN, date of birth, ID band Patient awake    Reviewed: Allergy & Precautions, H&P , NPO status , Patient's Chart, lab work & pertinent test results, reviewed documented beta blocker date and time   History of Anesthesia Complications (+) PONV and history of anesthetic complications  Airway Mallampati: II  TM Distance: >3 FB Neck ROM: full    Dental no notable dental hx.    Pulmonary neg pulmonary ROS, sleep apnea    Pulmonary exam normal breath sounds clear to auscultation       Cardiovascular Exercise Tolerance: Good hypertension, negative cardio ROS  Rhythm:regular Rate:Normal     Neuro/Psych   Anxiety      Neuromuscular disease negative neurological ROS  negative psych ROS   GI/Hepatic negative GI ROS, Neg liver ROS,,,  Endo/Other  diabetes  Morbid obesity  Renal/GU negative Renal ROS  negative genitourinary   Musculoskeletal   Abdominal   Peds  Hematology negative hematology ROS (+)   Anesthesia Other Findings   Reproductive/Obstetrics negative OB ROS                             Anesthesia Physical Anesthesia Plan  ASA: 3  Anesthesia Plan: General   Post-op Pain Management:    Induction:   PONV Risk Score and Plan: TIVA  Airway Management Planned:   Additional Equipment:   Intra-op Plan:   Post-operative Plan:   Informed Consent: I have reviewed the patients History and Physical, chart, labs and discussed the procedure including the risks, benefits and alternatives for the proposed anesthesia with the patient or authorized representative who has indicated his/her understanding and acceptance.     Dental Advisory Given  Plan Discussed with: CRNA  Anesthesia Plan Comments:        Anesthesia Quick Evaluation

## 2022-09-22 NOTE — Op Note (Signed)
09/22/2022  10:43 AM  PATIENT:  Mark Wiggins  52 y.o. adult  PRE-OPERATIVE DIAGNOSIS:  LEFT HALLUS RIGIDUS  POST-OPERATIVE DIAGNOSIS:  LEFT HALLUS RIGIDUS  PROCEDURE:  Procedure(s): FIRST MPJ HALLUX FUSION LEFT (Left)  SURGEON:  Surgeons and Role:    * Erskine Emery, DPM - Primary  PHYSICIAN ASSISTANT:   ASSISTANTS: none   ANESTHESIA:   local and MAC  BLOOD ADMINISTERED:none  MATERIALS used: [employer] MTPJ plate and screws, 4.0 Mm home run screw. 2.85mm x 3 screw, 3.78mm screws.   LOCAL MEDICATIONS USED:  MARCAINE   , LIDOCAINE , and Amount: 18 ml  TOURNIQUET:  Ankle tourniquet: 125 minute.   PLAN OF CARE: Discharge to home after PACU  PATIENT DISPOSITION:  PACU - hemodynamically stable.   Patient was brought into the operating room laid supine on the operating table. Ankle tourniquet was applied to the left surgical extremity. Following IV sedation, a local block was achieved using 10 cc of mixture of 1% plain lidocaine with 0.5% marcaine. Additional 8cc was given during intra-op. The left foot was the prepped, scrubbed and draped in aseptic manner. Using an esmarch band the tourniquet on the surgical site was inflatted at .    Attention was directed to the dorsal medial aspect of the left foot.  6 to 7 cm dorsal linear longitudinal incision was made medial and parallel to the extensor hallucis longus tendon.  Dissection was continued deep down to the level of the first metatarsophalangeal joint. A linear longitudinal incision was made along the dorsal aspect of the first metatarsophalangeal joint capsule.  The periosteal capsular structures were reflected from the head of the first metatarsal and base of the proximal phalanx.  The articular surface of the first metatarsal head was evaluated.  Approximately 85% of the articular surface was yellow and eroded completely. There was also loose bone at the base of the proximal phalanx and the head of  the first metatarsal. The bone was removed using bone rongeur.   Using a rotatory burr, the articular surface was resected from the head of the first metatarsal and base of the proximal phalanx. There was cancellous bone noted on both surfaces. The wound was flushed and irrigated this time. Any remaining dorsal and medial exostosis was removed using bone rongeur. At this time the subchondral drilling was performed with K wire at the base of the proximal phalanx and the head of the 1st metatarsal. A temporary K wire was inserted from distal medial metatarsal head to proximal lateral phalangeal base through the joint to position the toe. The position of the toe and the K wire was checked through fluoroscopy. A 5 degree dorsiflexion plate from [employer] first MPJ fusion, was applied at the dorsal aspect of the 1st MPJ using olive wire. Position of the plate was confirmed with fluoroscopy. At this the the plate was secured distally at the proximal phalanx using 3 x 2.30mm locking screw to secure the plate. At this time using guide from plating system, K wire was inserted from distal medial phalanx into proximal lateral metatarsal for compression screw. A 4.47mm screw was inserted across the MPJ. Fluroscopy was used to confirm the position of the screw. The proximal plate was now secured using 3x 3.5 screws. Adequate compression noted.  The surgical field was irrigated with copious amounts of sterile irrigant.  The periosteal and capsular structures were reapproximated using Vicryl. The subcutaneous structures reapproximated using Vicryl.  The skin was reapproximated  using Nylon.  A sterile compressive dressing was applied to the left foot.  The pneumatic ankle tourniquet was deflated and a prompt hyperemic response was noted to all digits of the left  foot.   Patient will be placed in cam walker and non-weightbearing.

## 2022-09-23 NOTE — Anesthesia Postprocedure Evaluation (Signed)
Anesthesia Post Note  Patient: MAUD TORREZ  Procedure(s) Performed: FIRST MPJ HALLUX FUSION LEFT (Left)  Patient location during evaluation: Phase II Anesthesia Type: General Level of consciousness: awake Pain management: pain level controlled Vital Signs Assessment: post-procedure vital signs reviewed and stable Respiratory status: spontaneous breathing and respiratory function stable Cardiovascular status: blood pressure returned to baseline and stable Postop Assessment: no headache and no apparent nausea or vomiting Anesthetic complications: no Comments: Late entry   No notable events documented.   Last Vitals:  Vitals:   09/22/22 1115 09/22/22 1120  BP:  105/79  Pulse: 84 88  Resp: 19 17  Temp:  36.7 C  SpO2: 100% 99%    Last Pain:  Vitals:   09/22/22 1120  TempSrc: Oral  PainSc: 0-No pain                 Windell Norfolk

## 2022-10-07 ENCOUNTER — Encounter (HOSPITAL_COMMUNITY): Payer: Self-pay | Admitting: Podiatry
